# Patient Record
Sex: Female | Born: 1940
Health system: Southern US, Community
[De-identification: ages and names within clinical notes are randomized; demographics above are authoritative.]

## PROBLEM LIST (undated history)

## (undated) DIAGNOSIS — E119 Type 2 diabetes mellitus without complications: Secondary | ICD-10-CM

## (undated) DIAGNOSIS — I10 Essential (primary) hypertension: Secondary | ICD-10-CM

---

## 2014-04-03 DIAGNOSIS — F411 Generalized anxiety disorder: Secondary | ICD-10-CM | POA: Insufficient documentation

## 2014-04-03 DIAGNOSIS — E785 Hyperlipidemia, unspecified: Secondary | ICD-10-CM | POA: Insufficient documentation

## 2014-04-03 DIAGNOSIS — M810 Age-related osteoporosis without current pathological fracture: Secondary | ICD-10-CM | POA: Insufficient documentation

## 2014-04-04 DIAGNOSIS — I10 Essential (primary) hypertension: Secondary | ICD-10-CM | POA: Insufficient documentation

## 2014-07-11 DIAGNOSIS — J449 Chronic obstructive pulmonary disease, unspecified: Secondary | ICD-10-CM | POA: Insufficient documentation

## 2014-07-11 DIAGNOSIS — M5430 Sciatica, unspecified side: Secondary | ICD-10-CM | POA: Insufficient documentation

## 2014-07-11 DIAGNOSIS — N289 Disorder of kidney and ureter, unspecified: Secondary | ICD-10-CM | POA: Insufficient documentation

## 2014-08-07 DIAGNOSIS — E119 Type 2 diabetes mellitus without complications: Secondary | ICD-10-CM | POA: Insufficient documentation

## 2015-01-30 DIAGNOSIS — E876 Hypokalemia: Secondary | ICD-10-CM | POA: Insufficient documentation

## 2019-02-03 ENCOUNTER — Emergency Department (HOSPITAL_BASED_OUTPATIENT_CLINIC_OR_DEPARTMENT_OTHER)
Admission: EM | Admit: 2019-02-03 | Discharge: 2019-02-03 | Disposition: A | Discharge status: Home / Self Care | Payer: BLUE CROSS/BLUE SHIELD | Attending: Emergency Medicine | Admitting: Emergency Medicine

## 2019-02-03 ENCOUNTER — Other Ambulatory Visit: Payer: Self-pay

## 2019-02-03 ENCOUNTER — Emergency Department (HOSPITAL_BASED_OUTPATIENT_CLINIC_OR_DEPARTMENT_OTHER): Payer: BLUE CROSS/BLUE SHIELD

## 2019-02-03 ENCOUNTER — Encounter (HOSPITAL_BASED_OUTPATIENT_CLINIC_OR_DEPARTMENT_OTHER): Payer: Self-pay | Admitting: Emergency Medicine

## 2019-02-03 DIAGNOSIS — Z7982 Long term (current) use of aspirin: Secondary | ICD-10-CM | POA: Diagnosis not present

## 2019-02-03 DIAGNOSIS — I10 Essential (primary) hypertension: Secondary | ICD-10-CM | POA: Diagnosis not present

## 2019-02-03 DIAGNOSIS — M79605 Pain in left leg: Secondary | ICD-10-CM | POA: Diagnosis not present

## 2019-02-03 DIAGNOSIS — E876 Hypokalemia: Secondary | ICD-10-CM | POA: Diagnosis not present

## 2019-02-03 DIAGNOSIS — Z79899 Other long term (current) drug therapy: Secondary | ICD-10-CM | POA: Diagnosis not present

## 2019-02-03 DIAGNOSIS — E119 Type 2 diabetes mellitus without complications: Secondary | ICD-10-CM | POA: Diagnosis not present

## 2019-02-03 HISTORY — DX: Type 2 diabetes mellitus without complications: E11.9

## 2019-02-03 HISTORY — DX: Essential (primary) hypertension: I10

## 2019-02-03 LAB — CBC WITH DIFFERENTIAL/PLATELET
Abs Immature Granulocytes: 0.02 10*3/uL (ref 0.00–0.07)
Basophils Absolute: 0.1 10*3/uL (ref 0.0–0.1)
Basophils Relative: 1 %
Eosinophils Absolute: 0.2 10*3/uL (ref 0.0–0.5)
Eosinophils Relative: 3 %
HCT: 38.3 % (ref 36.0–46.0)
Hemoglobin: 12 g/dL (ref 12.0–15.0)
Immature Granulocytes: 0 %
Lymphocytes Relative: 17 %
Lymphs Abs: 1 10*3/uL (ref 0.7–4.0)
MCH: 28.7 pg (ref 26.0–34.0)
MCHC: 31.3 g/dL (ref 30.0–36.0)
MCV: 91.6 fL (ref 80.0–100.0)
Monocytes Absolute: 0.5 10*3/uL (ref 0.1–1.0)
Monocytes Relative: 9 %
Neutro Abs: 4 10*3/uL (ref 1.7–7.7)
Neutrophils Relative %: 70 %
Platelets: 291 10*3/uL (ref 150–400)
RBC: 4.18 MIL/uL (ref 3.87–5.11)
RDW: 14.4 % (ref 11.5–15.5)
WBC: 5.7 10*3/uL (ref 4.0–10.5)
nRBC: 0 % (ref 0.0–0.2)

## 2019-02-03 LAB — BASIC METABOLIC PANEL
ANION GAP: 11 (ref 5–15)
BUN: 13 mg/dL (ref 8–23)
CO2: 27 mmol/L (ref 22–32)
Calcium: 9.1 mg/dL (ref 8.9–10.3)
Chloride: 101 mmol/L (ref 98–111)
Creatinine, Ser: 0.61 mg/dL (ref 0.44–1.00)
GFR calc Af Amer: >60 mL/min (ref >60)
GFR calc non Af Amer: >60 mL/min (ref >60)
Glucose, Bld: 130 mg/dL — ABNORMAL HIGH (ref 70–99)
Potassium: 3.4 mmol/L — ABNORMAL LOW (ref 3.5–5.1)
Sodium: 139 mmol/L (ref 135–145)

## 2019-02-03 LAB — MAGNESIUM: Magnesium: 1.8 mg/dL (ref 1.7–2.4)

## 2019-02-03 MED ORDER — HYDROCODONE-ACETAMINOPHEN 5-325 MG PO TABS: 1.0000 | ORAL_TABLET | ORAL | 0 refills | Status: DC | PRN

## 2019-02-03 MED ORDER — POTASSIUM CHLORIDE CRYS ER 20 MEQ PO TBCR
40.0000 meq | EXTENDED_RELEASE_TABLET | Freq: Once | ORAL | Status: AC
  Administered 2019-02-03: 40 meq via ORAL
  Filled 2019-02-03: qty 2

## 2019-02-03 MED ORDER — HYDROCODONE-ACETAMINOPHEN 5-325 MG PO TABS
1.0000 | ORAL_TABLET | Freq: Once | ORAL | Status: AC
  Administered 2019-02-03: 1 via ORAL
  Filled 2019-02-03: qty 1

## 2019-02-03 NOTE — ED Provider Notes (Signed)
[facility] EMERGENCY DEPARTMENT Provider Note   CSN: [id number] Arrival date & time: 02/03/19  1027    History   Chief Complaint Chief Complaint  Patient presents with  . Leg Pain    HPI Stephanie Pace is a 78 y.o. female.     Pt presents to the ED today with left leg pain.  It has been hurting for the past few days.  It is worse when she first gets up in the morning, and usually goes away.  Yesterday, it would not go away.  She denies any trauma.  She denies sob or cp.     Past Medical History:  Diagnosis Date  . Diabetes mellitus without complication (HCC)   . Hypertension     There are no active problems to display for this patient.   History reviewed. No pertinent surgical history.   OB History   No obstetric history on file.      Home Medications    Prior to Admission medications   Medication Sig Start Date End Date Taking? Authorizing Provider  aspirin EC 81 MG tablet Take by mouth. 04/03/14  Yes [provider]  atorvastatin (LIPITOR) 20 MG tablet Take by mouth. 06/22/17  Yes [provider]  omeprazole (PRILOSEC OTC) 20 MG tablet Take by mouth. 04/04/14  Yes [provider]  pioglitazone (ACTOS) 30 MG tablet TAKE 1 TABLET BY MOUTH DAILY 07/13/17  Yes [provider]  HYDROcodone-acetaminophen (NORCO/VICODIN) 5-325 MG tablet Take 1 tablet by mouth every 4 (four) hours as needed. 02/03/19   Jacalyn Lefevre, MD  metFORMIN (GLUCOPHAGE) 500 MG tablet  01/27/19   [provider]  valsartan-hydrochlorothiazide (DIOVAN-HCT) 320-25 MG tablet TK 1 T PO D 10/22/18   [provider]    Family History History reviewed. No pertinent family history.  Social History Social History   Tobacco Use  . Smoking status: Current Every Day Smoker    Types: Cigarettes  . Smokeless tobacco: Never Used  Substance Use Topics  . Alcohol use: Never    Frequency: Never  . Drug use: Never     Allergies   Patient  has no known allergies.   Review of Systems Review of Systems  Musculoskeletal:       Left leg pain  All other systems reviewed and are negative.    Physical Exam Updated Vital Signs BP (!) 185/67 (BP Location: Right Arm)   Pulse 81   Temp 98.2 F (36.8 C) (Oral)   Resp 18   Ht  (1.626 m)   Wt 81.6 kg   SpO2 95%   BMI 30.90 kg/m   Physical Exam Vitals signs and nursing note reviewed.  Constitutional:      Appearance: Normal appearance.  HENT:     Head: Normocephalic and atraumatic.     Right Ear: External ear normal.     Left Ear: External ear normal.     Nose: Nose normal.     Mouth/Throat:     Mouth: Mucous membranes are moist.  Eyes:     Extraocular Movements: Extraocular movements intact.     Pupils: Pupils are equal, round, and reactive to light.  Neck:     Musculoskeletal: Normal range of motion and neck supple.  Cardiovascular:     Rate and Rhythm: Normal rate and regular rhythm.     Pulses: Normal pulses.     Heart sounds: Normal heart sounds.  Pulmonary:     Effort: Pulmonary effort is normal.  Breath sounds: Normal breath sounds.  Abdominal:     General: Abdomen is flat.     Palpations: Abdomen is soft.  Musculoskeletal: Normal range of motion.  Skin:    General: Skin is warm.     Capillary Refill: Capillary refill takes less than 2 seconds.  Neurological:     General: No focal deficit present.     Mental Status: She is alert and oriented to person, place, and time.  Psychiatric:        Mood and Affect: Mood normal.        Behavior: Behavior normal.      ED Treatments / Results  Labs (all labs ordered are listed, but only abnormal results are displayed) Labs Reviewed  BASIC METABOLIC PANEL - Abnormal; Notable for the following components:      Result Value   Potassium 3.4 (*)    Glucose, Bld 130 (*)    All other components within normal limits  CBC WITH DIFFERENTIAL/PLATELET  MAGNESIUM    EKG None  Radiology US Venous  Img Lower Unilateral Left  Result Date: 02/03/2019 CLINICAL DATA:  78 year old female with a history of pain and swelling EXAM: LEFT LOWER EXTREMITY VENOUS DOPPLER ULTRASOUND TECHNIQUE: Gray-scale sonography with graded compression, as well as color Doppler and duplex ultrasound were performed to evaluate the lower extremity deep venous systems from the level of the common femoral vein and including the common femoral, femoral, profunda femoral, popliteal and calf veins including the posterior tibial, peroneal and gastrocnemius veins when visible. The superficial great saphenous vein was also interrogated. Spectral Doppler was utilized to evaluate flow at rest and with distal augmentation maneuvers in the common femoral, femoral and popliteal veins. COMPARISON:  None. FINDINGS: Contralateral Common Femoral Vein: Respiratory phasicity is normal and symmetric with the symptomatic side. No evidence of thrombus. Normal compressibility. Common Femoral Vein: No evidence of thrombus. Normal compressibility, respiratory phasicity and response to augmentation. Saphenofemoral Junction: No evidence of thrombus. Normal compressibility and flow on color Doppler imaging. Profunda Femoral Vein: No evidence of thrombus. Normal compressibility and flow on color Doppler imaging. Femoral Vein: No evidence of thrombus. Normal compressibility, respiratory phasicity and response to augmentation. Popliteal Vein: No evidence of thrombus. Normal compressibility, respiratory phasicity and response to augmentation. Calf Veins: No evidence of thrombus. Normal compressibility and flow on color Doppler imaging. Superficial Great Saphenous Vein: No evidence of thrombus. Normal compressibility and flow on color Doppler imaging. Other Findings:  None. IMPRESSION: Sonographic survey of the left lower extremity negative for DVT Electronically Signed   By: Gilmer Mor D.O.   On: 02/03/2019 11:42    Procedures Procedures (including critical  care time)  Medications Ordered in ED Medications  potassium chloride SA (K-DUR,KLOR-CON) CR tablet 40 mEq (has no administration in time range)  HYDROcodone-acetaminophen (NORCO/VICODIN) 5-325 MG per tablet 1 tablet (1 tablet Oral Given 02/03/19 1035)     Initial Impression / Assessment and Plan / ED Course  I have reviewed the triage vital signs and the nursing notes.  Pertinent labs & imaging results that were available during my care of the patient were reviewed by me and considered in my medical decision making (see chart for details).       Pt is mildly hypokalemia, so she is given 40 meq of Kdur here.  She does not have a DVT.  She likely has a msk strain.  She is instructed to return if worse.  F/u with pcp.  Final Clinical Impressions(s) / ED Diagnoses  Final diagnoses:  Left leg pain  Hypokalemia    ED Discharge Orders         Ordered    HYDROcodone-acetaminophen (NORCO/VICODIN) 5-325 MG tablet  Every 4 hours PRN     02/03/19 1154           Jacalyn Lefevre, MD 02/03/19 1155

## 2019-02-03 NOTE — ED Triage Notes (Signed)
Reports right leg pain x 2 days without injury.

## 2019-02-03 NOTE — ED Notes (Signed)
Patient transported to Ultrasound 

## 2019-02-04 ENCOUNTER — Ambulatory Visit (INDEPENDENT_AMBULATORY_CARE_PROVIDER_SITE_OTHER): Payer: Self-pay

## 2019-02-04 ENCOUNTER — Ambulatory Visit (INDEPENDENT_AMBULATORY_CARE_PROVIDER_SITE_OTHER): Payer: BLUE CROSS/BLUE SHIELD | Admitting: Family Medicine

## 2019-02-04 ENCOUNTER — Encounter (INDEPENDENT_AMBULATORY_CARE_PROVIDER_SITE_OTHER): Payer: Self-pay | Admitting: Family Medicine

## 2019-02-04 DIAGNOSIS — M79605 Pain in left leg: Secondary | ICD-10-CM

## 2019-02-04 MED ORDER — NABUMETONE 750 MG PO TABS: 750.0000 mg | ORAL_TABLET | Freq: Two times a day (BID) | ORAL | 6 refills | Status: DC | PRN

## 2019-02-04 NOTE — Progress Notes (Signed)
Office Visit Note   Patient: Stephanie Pace           Date of Birth: May 26, 1941           MRN: [id number] Visit Date: 02/04/2019 Requested by: Shellia Cleverly, PA [address] [place], [place] [address] PCP: Shellia Cleverly, [place]  Subjective: Chief Complaint  Patient presents with  . Left Leg - Pain    Pain in thigh since last week, mainly with getting up and down from seated position. Now also hurts when walking. No numbness/tingling.    HPI: She is here with left leg pain.  Symptoms started last week, no injury.  She got up and felt pain from the left posterior lateral hip to the medial thigh to the anterior tibia.  No change in her activities to account for her pain.  She got progressively worse and went to urgent care yesterday where Dopplers were negative for DVT.  She is not taking anything for her pain.  Denies any low back pain, denies any numbness or weakness in her leg.  No history of stress fractures.  She has a history of diabetes, COPD, osteoporosis and hypertension.               ROS: Other systems are reviewed and are negative.  Objective: Vital Signs: There were no vitals taken for this visit.  Physical Exam:  Left leg: No significant tenderness over the lumbar spine or in the sciatic notch.  Moderate tenderness over the left greater trochanter and that seems to reproduce some of her pain.  No pain with internal hip rotation and her range of motion is good.  There is slight tenderness on the medial quadricep/VMO.  No knee effusion, good range of motion.  No significant joint line tenderness at the knee.  There is moderate tenderness at the tibia anteriorly proximal one third.  Lower extremity strength and reflexes are normal.  Imaging: X-rays left hip: Well-preserved joint space, very little arthritis.  No sign of stress fracture or neoplasm.  X-rays left tibia/fibula: Knee and ankle joints look healthy.  No sign of tibia stress fracture.   Assessment & Plan: 1.  Left hip and  leg pain, etiology uncertain.  Could be tendinopathy but cannot rule out lumbar radiculopathy, although not having any significant back pain. -Short-term use of anti-inflammatory.  Straight leg raises for strengthening.  Consider physical therapy if symptoms persist.     Procedures: No procedures performed  No notes on file     PMFS History: Patient Active Problem List   Diagnosis Date Noted  . Hypokalemia 01/30/2015  . Type 2 diabetes mellitus without complication (HCC) 08/07/2014  . COPD (chronic obstructive pulmonary disease) (HCC) 07/11/2014  . Renal insufficiency 07/11/2014  . Sciatica 07/11/2014  . Essential hypertension 04/04/2014  . Generalized anxiety disorder 04/03/2014  . Hyperlipidemia LDL goal <100 04/03/2014  . Osteoporosis 04/03/2014   Past Medical History:  Diagnosis Date  . Diabetes mellitus without complication (HCC)   . Hypertension     History reviewed. No pertinent family history.  History reviewed. No pertinent surgical history. Social History   Occupational History  . Not on file  Tobacco Use  . Smoking status: Current Every Day Smoker    Types: Cigarettes  . Smokeless tobacco: Never Used  Substance and Sexual Activity  . Alcohol use: Never    Frequency: Never  . Drug use: Never  . Sexual activity: Not on file

## 2019-02-07 ENCOUNTER — Telehealth (INDEPENDENT_AMBULATORY_CARE_PROVIDER_SITE_OTHER): Payer: Self-pay

## 2019-02-07 ENCOUNTER — Encounter (INDEPENDENT_AMBULATORY_CARE_PROVIDER_SITE_OTHER): Payer: Self-pay

## 2019-02-07 NOTE — Telephone Encounter (Signed)
Patient requesting note to be out of work starting today for one week, since she's unable to sit, increasingly uncomfortable over the weekend  Fax#[phone] for her work  [employer] #[phone]

## 2019-02-07 NOTE — Telephone Encounter (Signed)
I wrote and faxed a note - patient advised.

## 2019-02-08 MED ORDER — TIZANIDINE HCL 2 MG PO TABS: 2.0000 mg | ORAL_TABLET | Freq: Four times a day (QID) | ORAL | 1 refills | Status: DC | PRN

## 2019-02-08 MED ORDER — METHYLPREDNISOLONE 4 MG PO TBPK: ORAL_TABLET | ORAL | 0 refills | Status: DC

## 2019-02-08 NOTE — Addendum Note (Signed)
Addended by: Lillia Carmel on: 02/08/2019 12:19 PM   Modules accepted: Orders

## 2019-02-08 NOTE — Addendum Note (Signed)
Addended by: Ariadna Setter J on: 02/08/2019 12:19 PM   Modules accepted: Orders  

## 2019-02-08 NOTE — Telephone Encounter (Signed)
I advised the patient's husband of the new meds that were sent in. She should stop the Nabumetone while on the Medrol dosepak, but may resume it once she has finished it (if needed). They are to call back if symptoms worsen or if she fails to improve.

## 2019-02-08 NOTE — Telephone Encounter (Signed)
Muscle relaxant and medrol pack sent to pharmacy.  Not sure if injection would help.  If pain doesn't improve, might order lumbar MRI.

## 2019-02-08 NOTE — Telephone Encounter (Signed)
The patient reports having a lot of trouble getting out of bed first thing in the morning, due to the pain going down her buttock and leg.  She also has pain with standing up after sitting a while.  She & her husband want to know if a muscle relaxer might or even a cortisone injection.

## 2020-01-25 ENCOUNTER — Encounter: Payer: Self-pay | Admitting: Family Medicine

## 2020-01-25 ENCOUNTER — Other Ambulatory Visit: Payer: Self-pay

## 2020-01-25 ENCOUNTER — Ambulatory Visit (INDEPENDENT_AMBULATORY_CARE_PROVIDER_SITE_OTHER): Payer: Medicare Other | Admitting: Family Medicine

## 2020-01-25 ENCOUNTER — Ambulatory Visit: Payer: Self-pay

## 2020-01-25 DIAGNOSIS — G8929 Other chronic pain: Secondary | ICD-10-CM | POA: Diagnosis not present

## 2020-01-25 DIAGNOSIS — M5442 Lumbago with sciatica, left side: Secondary | ICD-10-CM

## 2020-01-25 NOTE — Progress Notes (Signed)
   Office Visit Note   Patient: Stephanie Pace           Date of Birth: 02-01-1941           MRN: [id number] Visit Date: 01/25/2020 Requested by: Shellia Cleverly, PA [address] [place],  [place] [address] PCP: Shellia Cleverly, [place]  Subjective: Chief Complaint  Patient presents with  . Lower Back - Pain    Pain across the lower back and down the left leg. Pain anterior and posterior thigh at times. No numbness/tingling, but has some burning sensations in the leg.    HPI: She is here with persistent left leg pain and low back pain.  Last time we talked her symptoms were primarily in the leg and she continues to have intermittent pain to this day.  But in the past couple weeks her lower back has been bothering her as well.  No numbness or tingling, no weakness in her leg.  Extra strength Tylenol gets rid of her pain for 7 or 8 hours.  Pain is very manageable right now.  Denies any bowel or bladder dysfunction.              ROS: No fevers or chills.  All other systems were reviewed and are negative.  Objective: Vital Signs: There were no vitals taken for this visit.  Physical Exam:  General:  Alert and oriented, in no acute distress. Pulm:  Breathing unlabored. Psy:  Normal mood, congruent affect. Skin: No rash. Low back: Slightly tender in the midline L4-5 level.  No significant pain in the sciatic notch or over the greater trochanter.  Good hip range of motion and no pain with internal rotation.  Negative straight leg raise, lower extremity strength and reflexes are normal.  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  Mild at the upper levels.  Hip joints are well-preserved, SI joints are unremarkable, no sign of neoplasm or compression deformity.    Assessment & Plan: 1.  Persistent left leg pain now with low back pain, question whether she might have foraminal stenosis. -We discussed options, her symptoms are very manageable with  over-the-counter Tylenol right now.  As long as this continues to work she will follow-up as needed.  If symptoms worsen, then physical therapy.  If still no relief, then MRI scan.     Procedures: No procedures performed  No notes on file     PMFS History: Patient Active Problem List   Diagnosis Date Noted  . Hypokalemia 01/30/2015  . Type 2 diabetes mellitus without complication (HCC) 08/07/2014  . COPD (chronic obstructive pulmonary disease) (HCC) 07/11/2014  . Renal insufficiency 07/11/2014  . Sciatica 07/11/2014  . Essential hypertension 04/04/2014  . Generalized anxiety disorder 04/03/2014  . Hyperlipidemia LDL goal <100 04/03/2014  . Osteoporosis 04/03/2014   Past Medical History:  Diagnosis Date  . Diabetes mellitus without complication (HCC)   . Hypertension     History reviewed. No pertinent family history.  History reviewed. No pertinent surgical history. Social History   Occupational History  . Not on file  Tobacco Use  . Smoking status: Current Every Day Smoker    Types: Cigarettes  . Smokeless tobacco: Never Used  Substance and Sexual Activity  . Alcohol use: Never  . Drug use: Never  . Sexual activity: Not on file

## 2021-01-21 ENCOUNTER — Emergency Department (HOSPITAL_BASED_OUTPATIENT_CLINIC_OR_DEPARTMENT_OTHER): Payer: Medicare Other

## 2021-01-21 ENCOUNTER — Other Ambulatory Visit (HOSPITAL_COMMUNITY): Payer: Self-pay | Admitting: Student

## 2021-01-21 ENCOUNTER — Other Ambulatory Visit: Payer: Self-pay

## 2021-01-21 ENCOUNTER — Emergency Department (HOSPITAL_BASED_OUTPATIENT_CLINIC_OR_DEPARTMENT_OTHER)
Admission: EM | Admit: 2021-01-21 | Discharge: 2021-01-21 | Disposition: A | Discharge status: Home / Self Care | Payer: Medicare Other | Attending: Emergency Medicine | Admitting: Emergency Medicine

## 2021-01-21 ENCOUNTER — Encounter (HOSPITAL_BASED_OUTPATIENT_CLINIC_OR_DEPARTMENT_OTHER): Payer: Self-pay | Admitting: Emergency Medicine

## 2021-01-21 ENCOUNTER — Telehealth: Payer: Self-pay | Admitting: Family Medicine

## 2021-01-21 DIAGNOSIS — E119 Type 2 diabetes mellitus without complications: Secondary | ICD-10-CM | POA: Insufficient documentation

## 2021-01-21 DIAGNOSIS — I4891 Unspecified atrial fibrillation: Secondary | ICD-10-CM | POA: Diagnosis not present

## 2021-01-21 DIAGNOSIS — F17210 Nicotine dependence, cigarettes, uncomplicated: Secondary | ICD-10-CM | POA: Insufficient documentation

## 2021-01-21 DIAGNOSIS — I10 Essential (primary) hypertension: Secondary | ICD-10-CM | POA: Diagnosis not present

## 2021-01-21 DIAGNOSIS — M79605 Pain in left leg: Secondary | ICD-10-CM | POA: Insufficient documentation

## 2021-01-21 DIAGNOSIS — Z7984 Long term (current) use of oral hypoglycemic drugs: Secondary | ICD-10-CM | POA: Insufficient documentation

## 2021-01-21 DIAGNOSIS — Z79899 Other long term (current) drug therapy: Secondary | ICD-10-CM | POA: Insufficient documentation

## 2021-01-21 DIAGNOSIS — J449 Chronic obstructive pulmonary disease, unspecified: Secondary | ICD-10-CM | POA: Diagnosis not present

## 2021-01-21 DIAGNOSIS — Z7901 Long term (current) use of anticoagulants: Secondary | ICD-10-CM | POA: Diagnosis not present

## 2021-01-21 DIAGNOSIS — H1013 Acute atopic conjunctivitis, bilateral: Secondary | ICD-10-CM | POA: Insufficient documentation

## 2021-01-21 LAB — BASIC METABOLIC PANEL
Anion gap: 10 (ref 5–15)
BUN: 11 mg/dL (ref 8–23)
CO2: 30 mmol/L (ref 22–32)
Calcium: 8.1 mg/dL — ABNORMAL LOW (ref 8.9–10.3)
Chloride: 96 mmol/L — ABNORMAL LOW (ref 98–111)
Creatinine, Ser: 0.64 mg/dL (ref 0.44–1.00)
GFR, Estimated: >60 mL/min (ref >60)
Glucose, Bld: 110 mg/dL — ABNORMAL HIGH (ref 70–99)
Potassium: 2.7 mmol/L — CL (ref 3.5–5.1)
Sodium: 136 mmol/L (ref 135–145)

## 2021-01-21 LAB — COMPREHENSIVE METABOLIC PANEL WITH GFR
ALT: 12 U/L (ref 0–44)
AST: 19 U/L (ref 15–41)
Albumin: 3.5 g/dL (ref 3.5–5.0)
Alkaline Phosphatase: 41 U/L (ref 38–126)
Anion gap: 12 (ref 5–15)
BUN: 12 mg/dL (ref 8–23)
CO2: 32 mmol/L (ref 22–32)
Calcium: 8.9 mg/dL (ref 8.9–10.3)
Chloride: 92 mmol/L — ABNORMAL LOW (ref 98–111)
Creatinine, Ser: 0.69 mg/dL (ref 0.44–1.00)
GFR, Estimated: >60 mL/min
Glucose, Bld: 115 mg/dL — ABNORMAL HIGH (ref 70–99)
Potassium: 2.3 mmol/L — CL (ref 3.5–5.1)
Sodium: 136 mmol/L (ref 135–145)
Total Bilirubin: 0.8 mg/dL (ref 0.3–1.2)
Total Protein: 6.6 g/dL (ref 6.5–8.1)

## 2021-01-21 LAB — CBC WITH DIFFERENTIAL/PLATELET
Abs Immature Granulocytes: 0.01 K/uL (ref 0.00–0.07)
Basophils Absolute: 0 K/uL (ref 0.0–0.1)
Basophils Relative: 1 %
Eosinophils Absolute: 0.1 K/uL (ref 0.0–0.5)
Eosinophils Relative: 1 %
HCT: 35 % — ABNORMAL LOW (ref 36.0–46.0)
Hemoglobin: 10.9 g/dL — ABNORMAL LOW (ref 12.0–15.0)
Immature Granulocytes: 0 %
Lymphocytes Relative: 16 %
Lymphs Abs: 1.1 K/uL (ref 0.7–4.0)
MCH: 27 pg (ref 26.0–34.0)
MCHC: 31.1 g/dL (ref 30.0–36.0)
MCV: 86.8 fL (ref 80.0–100.0)
Monocytes Absolute: 0.6 K/uL (ref 0.1–1.0)
Monocytes Relative: 9 %
Neutro Abs: 4.8 K/uL (ref 1.7–7.7)
Neutrophils Relative %: 73 %
Platelets: 367 K/uL (ref 150–400)
RBC: 4.03 MIL/uL (ref 3.87–5.11)
RDW: 16.4 % — ABNORMAL HIGH (ref 11.5–15.5)
WBC: 6.6 K/uL (ref 4.0–10.5)
nRBC: 0 % (ref 0.0–0.2)

## 2021-01-21 LAB — MAGNESIUM: Magnesium: 1.5 mg/dL — ABNORMAL LOW (ref 1.7–2.4)

## 2021-01-21 MED ORDER — POTASSIUM CHLORIDE ER 10 MEQ PO TBCR: 20.0000 meq | EXTENDED_RELEASE_TABLET | Freq: Every day | ORAL | 0 refills | Status: DC

## 2021-01-21 MED ORDER — HYDRALAZINE HCL 25 MG PO TABS
50.0000 mg | ORAL_TABLET | Freq: Once | ORAL | Status: AC
  Administered 2021-01-21: 50 mg via ORAL
  Filled 2021-01-21: qty 2

## 2021-01-21 MED ORDER — MAGNESIUM SULFATE 50 % IJ SOLN
2.0000 g | Freq: Once | INTRAMUSCULAR | Status: AC
  Administered 2021-01-21: 2 g via INTRAVENOUS
  Filled 2021-01-21: qty 4

## 2021-01-21 MED ORDER — HYDRALAZINE HCL 25 MG PO TABS: 25.0000 mg | ORAL_TABLET | Freq: Two times a day (BID) | ORAL | 0 refills | Status: DC | PRN

## 2021-01-21 MED ORDER — POTASSIUM CHLORIDE CRYS ER 20 MEQ PO TBCR
40.0000 meq | EXTENDED_RELEASE_TABLET | Freq: Once | ORAL | Status: AC
  Administered 2021-01-21: 40 meq via ORAL
  Filled 2021-01-21: qty 2

## 2021-01-21 MED ORDER — POTASSIUM CHLORIDE CRYS ER 20 MEQ PO TBCR
20.0000 meq | EXTENDED_RELEASE_TABLET | Freq: Once | ORAL | Status: DC
  Filled 2021-01-21: qty 1

## 2021-01-21 MED ORDER — APIXABAN 5 MG PO TABS: 5.0000 mg | ORAL_TABLET | Freq: Two times a day (BID) | ORAL | 0 refills | Status: DC

## 2021-01-21 MED ORDER — POTASSIUM CHLORIDE CRYS ER 20 MEQ PO TBCR
30.0000 meq | EXTENDED_RELEASE_TABLET | Freq: Once | ORAL | Status: AC
  Administered 2021-01-21: 30 meq via ORAL

## 2021-01-21 MED ORDER — POTASSIUM CHLORIDE 10 MEQ/100ML IV SOLN
10.0000 meq | INTRAVENOUS | Status: AC
  Administered 2021-01-21 (×2): 10 meq via INTRAVENOUS
  Filled 2021-01-21 (×2): qty 100

## 2021-01-21 MED ORDER — APIXABAN 2.5 MG PO TABS
5.0000 mg | ORAL_TABLET | Freq: Two times a day (BID) | ORAL | Status: DC
  Administered 2021-01-21: 5 mg via ORAL
  Filled 2021-01-21: qty 2

## 2021-01-21 MED ORDER — FLUTICASONE PROPIONATE 50 MCG/ACT NA SUSP: 2.0000 | Freq: Every day | NASAL | 0 refills | Status: DC

## 2021-01-21 MED FILL — ELIQUIS 5 MG TABLET: 5 | 30 days supply | Qty: 60 | Fill #0

## 2021-01-21 NOTE — ED Notes (Signed)
Patient is reporting intermittent blurred vision that starts in the morning and recedes some in the evening. Patient reports bilateral problems with vision but the right eye is worse than the left. Patient reports she currently has blurred vision.

## 2021-01-21 NOTE — ED Notes (Signed)
Blood recollected for CMP and Magnesium level check.

## 2021-01-21 NOTE — Telephone Encounter (Signed)
Patient and Chrystine Oiler called requesting an appointment. Eunice Blase is asking for patient to be seen today. [name] asked me to send this message to Camelia Eng about pt. Patient states she is experiencing blurred vision and sever left leg pains. Blurred eyes started alittle over a week and leg pains started a week ago. Please call [name] at [phone].

## 2021-01-21 NOTE — ED Notes (Signed)
ED Provider at bedside. 

## 2021-01-21 NOTE — Discharge Instructions (Addendum)
Please follow-up with your primary care provider in regards to today's visits in regards to your blood pressure management, your new A. fib, your allergic conjunctivitis, your low potassium. Please follow-up with the ophthalmologist, in the meantime I want you to take the Flonase and the allergy medications that we discussed such as Zyrtec..  They may prescribe you an allergy drops that you can put into your eye, please use cool compresses on your eye.  You can use the attached instructions on this.  If you do notice that you have any pain with your eyes, complete loss of vision or any new worsening concerning symptoms please come back to the emergency department. Your blood pressure was significantly elevated today here, you need to talk to your primary care about alternating your blood pressure medication.  I would recommend stopping the valsartan-HCTZ and starting hydralazine, however please talk to your primary care about this. Your EKG did show new atrial fibrillation, please take the anticoagulant as we discussed, you see attached instructions on what A. fib was.  Do know that this is a blood thinner and there are side effects to this. I do advise stopping the Asprin, but please follow up with your PCP about this. Try to be careful about falling and if you to follow-up please come back to the emergency department, speak to your pharmacist about complete side effect profile and interactions of this. Take the potassium as discussed.  You have any chest pain or any new or worsening concerning symptoms like back to the emergency department.  Information on my medicine - ELIQUIS (apixaban)  This medication education was reviewed with me or my healthcare representative as part of my discharge preparation.   Why was Eliquis prescribed for you? Eliquis was prescribed for you to reduce the risk of a blood clot forming that can cause a stroke if you have a medical condition called atrial fibrillation  (a type of irregular heartbeat).  What do You need to know about Eliquis ? Take your Eliquis TWICE DAILY - one tablet in the morning and one tablet in the evening with or without food. If you have difficulty swallowing the tablet whole please discuss with your pharmacist how to take the medication safely.  Take Eliquis exactly as prescribed by your doctor and DO NOT stop taking Eliquis without talking to the doctor who prescribed the medication.  Stopping may increase your risk of developing a stroke.  Refill your prescription before you run out.  After discharge, you should have regular check-up appointments with your healthcare provider that is prescribing your Eliquis.  In the future your dose may need to be changed if your kidney function or weight changes by a significant amount or as you get older.  What do you do if you miss a dose? If you miss a dose, take it as soon as you remember on the same day and resume taking twice daily.  Do not take more than one dose of ELIQUIS at the same time to make up a missed dose.  Important Safety Information A possible side effect of Eliquis is bleeding. You should call your healthcare provider right away if you experience any of the following: Bleeding from an injury or your nose that does not stop. Unusual colored urine (red or dark brown) or unusual colored stools (red or black). Unusual bruising for unknown reasons. A serious fall or if you hit your head (even if there is no bleeding).  Some medicines may interact with  Eliquis and might increase your risk of bleeding or clotting while on Eliquis. To help avoid this, consult your healthcare provider or pharmacist prior to using any new prescription or non-prescription medications, including herbals, vitamins, non-steroidal anti-inflammatory drugs (NSAIDs) and supplements.  This website has more information on Eliquis (apixaban): http://www.eliquis.com/eliquis/home

## 2021-01-21 NOTE — ED Provider Notes (Signed)
[facility] EMERGENCY DEPARTMENT Provider Note   CSN: [id number] Arrival date & time: 01/21/21  1043     History Chief Complaint  Patient presents with  . Leg Pain    Stephanie Pace is a 80 y.o. female with past medical history of diabetes, hypertension, COPD, sciatica, renal insufficiency, osteoporosis, generalized anxiety disorder that presents to the emergency department today for left leg pain.  Patient states that the left leg pain started 3 days ago, is in her thigh on the left side, no right leg pain.  Patient states that she is pain-free when laying, worse when she walks.  States that she has been using a heating pad which has been helping slightly.  Patient states that the thing that helps the most is mustard seeds.  Has been taking anything else.  Patient states that it has been getting worse over the past 3 days.  Denies any falls or trauma in the last 2 weeks.  Pain does not radiate anywhere.  No back pain or hip pain.  No history of arthritis in the hip on that side.  Denies any fevers, chills, night sweats, nausea, vomiting, diarrhea, abdominal pain.  Denies any numbness or tingling in her leg, or groin.  Denies any cancer history, coagulation disorder, recent long travel.  Appears as if patient follows [facility] for [facility] care, daughter-in-law is also in room who states that she also wants to be evaluated for her blurred vision.  Patient states that she has had blurred vision for the past week and a half on both eyes started on right eye, now both eyes, this started after she had a head cold which she has had for the past 3 weeks.  States that she just had congestion and watery eyes for the past 3 weeks, now has blurred vision.  Denies any cough, fever, sore throat, facial pain, sick contacts.  Patient has not been vaccinated against COVID.  Patient states that blurred vision is primarily in the morning and then gets better throughout the day.  States that she  has a lot of crusting in these eyes especially in the morning when she does have blurred vision.  Patient her eyes, admits to itching.  Denies any eye pain or vision loss.  Denies any contact use or feeling as if there is something in her eye.  Patient states that she does wear glasses.  Denies any headache, dizziness, head trauma.  Patient states that she has been taking her blood pressure medication.  Has never been diagnosed with A. fib, is not on anticoagulant.  Denies any chest pain or shortness of breath.     After reviewing patient's records from February 11 at [facility], was recommended to come to the emergency department for questionable retinal vein occlusion.  I did speak to patient about this, she states that she did not go to the emergency department, states that she never had any vision loss, just had blurred vision on her right eye at that time, now has progressed to both eyes.  Has not followed up with ophthalmology.  HPI     Past Medical History:  Diagnosis Date  . Diabetes mellitus without complication (HCC)   . Hypertension     Patient Active Problem List   Diagnosis Date Noted  . Hypokalemia 01/30/2015  . Type 2 diabetes mellitus without complication (HCC) 08/07/2014  . COPD (chronic obstructive pulmonary disease) (HCC) 07/11/2014  . Renal insufficiency 07/11/2014  . Sciatica 07/11/2014  .  Essential hypertension 04/04/2014  . Generalized anxiety disorder 04/03/2014  . Hyperlipidemia LDL goal <100 04/03/2014  . Osteoporosis 04/03/2014    History reviewed. No pertinent surgical history.   OB History   No obstetric history on file.     No family history on file.  Social History   Tobacco Use  . Smoking status: Current Every Day Smoker    Types: Cigarettes  . Smokeless tobacco: Never Used  Substance Use Topics  . Alcohol use: Never  . Drug use: Never    Home Medications Prior to Admission medications   Medication Sig Start Date End Date  Taking? Authorizing Provider  atorvastatin (LIPITOR) 20 MG tablet Take by mouth. 06/22/17  Yes [provider]  calcium citrate-vitamin D (CITRACAL+D) 315-200 MG-UNIT tablet Take by mouth.   Yes [provider]  carvedilol (COREG) 25 MG tablet  08/18/18  Yes [provider]  fenofibrate 160 MG tablet  10/22/18  Yes [provider]  fluticasone (FLONASE) 50 MCG/ACT nasal spray Place 2 sprays into both nostrils daily for 7 doses. 01/21/21 01/28/21 Yes Dariann Huckaba, PA-C  hydrALAZINE (APRESOLINE) 25 MG tablet Take 1 tablet (25 mg total) by mouth 2 (two) times daily as needed. 01/21/21 02/20/21 Yes Farrel Gordon, PA-C  metFORMIN (GLUCOPHAGE) 500 MG tablet  01/27/19  Yes [provider]  omeprazole (PRILOSEC OTC) 20 MG tablet Take by mouth. 04/04/14  Yes [provider]  oxybutynin (DITROPAN-XL) 10 MG 24 hr tablet Take 10 mg by mouth at bedtime.   Yes [provider]  pioglitazone (ACTOS) 30 MG tablet TAKE 1 TABLET BY MOUTH DAILY 07/13/17  Yes [provider]  potassium chloride (KLOR-CON) 10 MEQ tablet Take 2 tablets (20 mEq total) by mouth daily for 10 days. 01/21/21 01/31/21 Yes Jaquala Fuller, PA-C  valsartan-hydrochlorothiazide (DIOVAN-HCT) 320-25 MG tablet TK 1 T PO D 10/22/18  Yes [provider]  apixaban (ELIQUIS) 5 MG TABS tablet Take 1 tablet (5 mg total) by mouth 2 (two) times daily. 01/21/21 02/20/21  Farrel Gordon, PA-C  tiZANidine (ZANAFLEX) 2 MG tablet Take 1-2 tablets (2-4 mg total) by mouth every 6 (six) hours as needed for muscle spasms. 02/08/19   Hilts, Casimiro Needle, MD    Allergies    Patient has no known allergies.  Review of Systems   Review of Systems  Constitutional: Negative for chills, diaphoresis, fatigue and fever.  HENT: Negative for congestion, sore throat and trouble swallowing.   Eyes: Positive for visual disturbance. Negative for photophobia, pain, redness and itching.  Respiratory: Negative for cough,  shortness of breath and wheezing.   Cardiovascular: Negative for chest pain, palpitations and leg swelling.  Gastrointestinal: Negative for abdominal distention, abdominal pain, diarrhea, nausea and vomiting.  Genitourinary: Negative for difficulty urinating.  Musculoskeletal: Positive for arthralgias. Negative for back pain, neck pain and neck stiffness.  Skin: Negative for pallor.  Neurological: Negative for dizziness, speech difficulty, weakness and headaches.  Psychiatric/Behavioral: Negative for confusion.    Physical Exam Updated Vital Signs BP (!) 186/90 (BP Location: Left Arm)   Pulse 74   Temp 98.3 F (36.8 C) (Oral)   Resp 18   Ht 5\' 4"  (1.626 m)   Wt 77.6 kg   SpO2 95%   BMI 29.35 kg/m   Physical Exam Constitutional:      General: She is not in acute distress.    Appearance: Normal appearance. She is not ill-appearing, toxic-appearing or diaphoretic.  HENT:     Mouth/Throat:  Mouth: Mucous membranes are moist.     Pharynx: Oropharynx is clear.  Eyes:     General: Lids are normal. Lids are everted, no foreign bodies appreciated. Vision grossly intact. No scleral icterus.    Extraocular Movements: Extraocular movements intact.     Right eye: Normal extraocular motion and no nystagmus.     Left eye: Normal extraocular motion and no nystagmus.     Conjunctiva/sclera: Conjunctivae normal.     Pupils: Pupils are equal, round, and reactive to light.     Comments: Bilateral eyes slightly pink, no true injection.  Drainage bilaterally from each eye, clear drainage.  EOMs intact, PERRLA.  No nystagmus.  No painful EOMs.  Some edema around superior lid bilaterally, no erythema or warmth.  No facial tenderness.  Cardiovascular:     Rate and Rhythm: Normal rate. Rhythm irregular.     Pulses: Normal pulses.     Heart sounds: Normal heart sounds.  Pulmonary:     Effort: Pulmonary effort is normal. No respiratory distress.     Breath sounds: Normal breath sounds. No  stridor. No wheezing, rhonchi or rales.  Chest:     Chest wall: No tenderness.  Abdominal:     General: Abdomen is flat. There is no distension.     Palpations: Abdomen is soft.     Tenderness: There is no abdominal tenderness. There is no guarding or rebound.  Musculoskeletal:        General: No swelling or tenderness. Normal range of motion.     Cervical back: Normal range of motion and neck supple. No rigidity.     Right lower leg: No edema.     Left lower leg: No edema.       Legs:     Comments: Patient with slight tenderness in this area, no erythema or warmth.  Good leg raise.  No edema.  No tenderness to left hip or back.  No objective numbness.  PT pulses bilaterally 2+ with good cap refill.  Compartments are soft.  Normal range of motion to hip knee and ankle.  Good sensation throughout.  Skin:    General: Skin is warm and dry.     Capillary Refill: Capillary refill takes less than 2 seconds.     Coloration: Skin is not pale.  Neurological:     General: No focal deficit present.     Mental Status: She is alert and oriented to person, place, and time.  Psychiatric:        Mood and Affect: Mood normal.        Behavior: Behavior normal.     ED Results / Procedures / Treatments   Labs (all labs ordered are listed, but only abnormal results are displayed) Labs Reviewed  CBC WITH DIFFERENTIAL/PLATELET - Abnormal; Notable for the following components:      Result Value   Hemoglobin 10.9 (*)    HCT 35.0 (*)    RDW 16.4 (*)    All other components within normal limits  COMPREHENSIVE METABOLIC PANEL - Abnormal; Notable for the following components:   Potassium 2.3 (*)    Chloride 92 (*)    Glucose, Bld 115 (*)    All other components within normal limits  MAGNESIUM - Abnormal; Notable for the following components:   Magnesium 1.5 (*)    All other components within normal limits  BASIC METABOLIC PANEL - Abnormal; Notable for the following components:   Potassium 2.7 (*)     Chloride 96 (*)  Glucose, Bld 110 (*)    Calcium 8.1 (*)    All other components within normal limits    EKG None  Radiology US Venous Img Lower  Left (DVT Study)  Result Date: 01/21/2021 CLINICAL DATA:  80 year old with left thigh pain for 1 month. EXAM: LEFT LOWER EXTREMITY VENOUS DOPPLER ULTRASOUND TECHNIQUE: Gray-scale sonography with graded compression, as well as color Doppler and duplex ultrasound were performed to evaluate the lower extremity deep venous systems from the level of the common femoral vein and including the common femoral, femoral, profunda femoral, popliteal and calf veins including the posterior tibial, peroneal and gastrocnemius veins when visible. The superficial great saphenous vein was also interrogated. Spectral Doppler was utilized to evaluate flow at rest and with distal augmentation maneuvers in the common femoral, femoral and popliteal veins. COMPARISON:  02/03/2019 FINDINGS: Contralateral Common Femoral Vein: Respiratory phasicity is normal and symmetric with the symptomatic side. No evidence of thrombus. Normal compressibility. Common Femoral Vein: No evidence of thrombus. Normal compressibility, respiratory phasicity and response to augmentation. Saphenofemoral Junction: No evidence of thrombus. Normal compressibility and flow on color Doppler imaging. Profunda Femoral Vein: No evidence of thrombus. Normal compressibility and flow on color Doppler imaging. Femoral Vein: No evidence of thrombus. Normal compressibility, respiratory phasicity and response to augmentation. Popliteal Vein: No evidence of thrombus. Normal compressibility, respiratory phasicity and response to augmentation. Calf Veins: No evidence of thrombus. Normal compressibility and flow on color Doppler imaging. Superficial Great Saphenous Vein: No evidence of thrombus. Normal compressibility. Venous Reflux:  None. Other Findings:  None. IMPRESSION: Negative for deep venous thrombosis in left lower  extremity. Electronically Signed   By: Richarda Overlie M.D.   On: 01/21/2021 12:05    Procedures Procedures   Medications Ordered in ED Medications  apixaban (ELIQUIS) tablet 5 mg (5 mg Oral Given 01/21/21 1539)  potassium chloride SA (KLOR-CON) CR tablet 30 mEq (has no administration in time range)  hydrALAZINE (APRESOLINE) tablet 50 mg (50 mg Oral Given 01/21/21 1144)  potassium chloride 10 mEq in 100 mL IVPB (0 mEq Intravenous Stopped 01/21/21 1438)  potassium chloride SA (KLOR-CON) CR tablet 40 mEq (40 mEq Oral Given 01/21/21 1303)  magnesium sulfate (IV Push/IM) injection 2 g (2 g Intravenous Given 01/21/21 1307)    ED Course  I have reviewed the triage vital signs and the nursing notes.  Pertinent labs & imaging results that were available during my care of the patient were reviewed by me and considered in my medical decision making (see chart for details).  Clinical Course as of 01/21/21 1622  Mon Jan 21, 2021  1320 I was directly involved in this patients medical care.  [JH]    Clinical Course User Index [JH] Cheryll Cockayne, MD   MDM Rules/Calculators/A&P     CHA2DS2-VASc Score: 5                     Stephanie Pace is a 80 y.o. female with past medical history of diabetes, hypertension, COPD, sciatica, renal insufficiency, osteoporosis, generalized anxiety disorder that presents to the emergency department today for left leg pain and blurred vision.   Patient is hypertensive in the emergency department today 194/78.  No neuro deficits, no head injury.  Denies any blurred vision currently.  Visual acuity exam with left eye 20/60, right eye 20/80 and bilateral 20/50..  Will give some hydralazine here and reevaluate.  EKG does show new A. Fib, rate controlled.  Patient's [place] vas score is  5, would recommend patient starting anticoagulation.  We will also refer to A. fib clinic.  Patient denies any chest pain or shortness of breath.  Did start patient on Eliquis, side effects discussed  with me and pharmacist.  In regards to patient's blurred vision, I think this is most likely allergic conjunctivitis from patient's head cold which I think is most likely allergies for the past 3 weeks.  Symptomatic treatment discussed in regards to this.  No red flag symptoms.  Patient follow-up with ophthalmology and PCP about this. No blurry vision while being here for 6 hours.   In regards to patient's leg pain, CMP with potassium of 2.3, most likely causing cramping sensation.  Will replete here.  Negative DVT study.  Patient is not having any chest pain.  CBC unremarkable, no leukocytosis.  No concerns for cellulitis.  Patient is distally neurovascularly intact with good peripheral pulses bilaterally.  Hemoglobin 10.9, patient's hemoglobin was 9.7 in 2021.  Upon reevaluation, patient's potassium repleted 2.7.  Upon reevaluation, blood pressure slightly better at 186/90, patient currently asymptomatic.  Shared decision making about patient coming in at this time due to this with new.A. fib with hypokalemia, however patient states that she wants to go home.  Discussed risk of this, patient to make own decisions at this time.  Will give more potassium here and have her follow-up with PCP in regards to patient's elevated blood pressure and blood pressure management, new A. fib started on Eliquis, low potassium most likely from HCTZ since patient does not have any losses, and have patient follow-up with ophthalmology.  Did discuss this in depth with patient and patient's family who was in the room.  Patient expressed understanding and repeated back to me what was going to occur.  Patient does have primary care appointment at 1 tomorrow.  Doubt need for further emergent work up at this time. I explained the diagnosis and have given explicit precautions to return to the ER including for any other new or worsening symptoms. The patient understands and accepts the medical plan as it's been dictated and I have  answered their questions. Discharge instructions concerning home care and prescriptions have been given. The patient is STABLE and is discharged to home in good condition.  I discussed this case with my attending physician who cosigned this note including patient's presenting symptoms, physical exam, and planned diagnostics and interventions. Attending physician stated agreement with plan or made changes to plan which were implemented.   Attending physician assessed patient at bedside.   Final Clinical Impression(s) / ED Diagnoses Final diagnoses:  Left leg pain  Atrial fibrillation, unspecified type (HCC)  Allergic conjunctivitis of both eyes    Rx / DC Orders ED Discharge Orders         Ordered    Amb Referral to AFIB Clinic        01/21/21 1138    apixaban (ELIQUIS) 5 MG TABS tablet  2 times daily,   Status:  Discontinued        01/21/21 1457    apixaban (ELIQUIS) 5 MG TABS tablet  2 times daily        01/21/21 1607    hydrALAZINE (APRESOLINE) 25 MG tablet  2 times daily PRN        01/21/21 1612    potassium chloride (KLOR-CON) 10 MEQ tablet  Daily        01/21/21 1612    fluticasone (FLONASE) 50 MCG/ACT nasal spray  Daily  01/21/21 1619           Farrel Gordon, PA-C 01/21/21 1626    Cheryll Cockayne, MD 01/26/21 1504

## 2021-01-21 NOTE — Telephone Encounter (Signed)
I called and spoke with both the patient and [name]. The patient is willing to go be evaluated at an urgent care -- I encouraged her to go ahead this morning, rather than wait until later in the day. Eunice Blase has said she will most likely take off from work and take her, to be sure that she does indeed go to be evaluated.

## 2021-01-21 NOTE — Telephone Encounter (Signed)
She should go to urgent care for this.  Needs more workup than we can provide.

## 2021-01-21 NOTE — ED Triage Notes (Addendum)
Left leg pain x 3- 4 days  , hurts to walk on it  crampy pain she states , has been using her heating pad  She states  Has had a bad cold x 3 weeks,  Has drainage left eye

## 2021-01-21 NOTE — Telephone Encounter (Signed)
Please advise 

## 2021-01-27 ENCOUNTER — Emergency Department (HOSPITAL_BASED_OUTPATIENT_CLINIC_OR_DEPARTMENT_OTHER): Payer: Medicare Other

## 2021-01-27 ENCOUNTER — Encounter (HOSPITAL_BASED_OUTPATIENT_CLINIC_OR_DEPARTMENT_OTHER): Payer: Self-pay | Admitting: Emergency Medicine

## 2021-01-27 ENCOUNTER — Other Ambulatory Visit: Payer: Self-pay

## 2021-01-27 ENCOUNTER — Emergency Department (HOSPITAL_BASED_OUTPATIENT_CLINIC_OR_DEPARTMENT_OTHER)
Admission: EM | Admit: 2021-01-27 | Discharge: 2021-01-27 | Disposition: A | Discharge status: Home / Self Care | Payer: Medicare Other | Attending: Physician Assistant | Admitting: Physician Assistant

## 2021-01-27 DIAGNOSIS — E119 Type 2 diabetes mellitus without complications: Secondary | ICD-10-CM | POA: Diagnosis not present

## 2021-01-27 DIAGNOSIS — J449 Chronic obstructive pulmonary disease, unspecified: Secondary | ICD-10-CM | POA: Insufficient documentation

## 2021-01-27 DIAGNOSIS — Z79899 Other long term (current) drug therapy: Secondary | ICD-10-CM | POA: Diagnosis not present

## 2021-01-27 DIAGNOSIS — Z7952 Long term (current) use of systemic steroids: Secondary | ICD-10-CM | POA: Insufficient documentation

## 2021-01-27 DIAGNOSIS — I10 Essential (primary) hypertension: Secondary | ICD-10-CM | POA: Insufficient documentation

## 2021-01-27 DIAGNOSIS — R52 Pain, unspecified: Secondary | ICD-10-CM

## 2021-01-27 DIAGNOSIS — F17210 Nicotine dependence, cigarettes, uncomplicated: Secondary | ICD-10-CM | POA: Insufficient documentation

## 2021-01-27 DIAGNOSIS — Z7984 Long term (current) use of oral hypoglycemic drugs: Secondary | ICD-10-CM | POA: Diagnosis not present

## 2021-01-27 DIAGNOSIS — M79605 Pain in left leg: Secondary | ICD-10-CM

## 2021-01-27 DIAGNOSIS — Z7901 Long term (current) use of anticoagulants: Secondary | ICD-10-CM | POA: Diagnosis not present

## 2021-01-27 LAB — POTASSIUM: Potassium: 3.5 mmol/L (ref 3.5–5.1)

## 2021-01-27 NOTE — Discharge Instructions (Addendum)
Your x-ray today did not show any evidence of fractures.  Potassium was normal today

## 2021-01-27 NOTE — ED Triage Notes (Addendum)
Seen previously for left upper leg pain.  DVT study negative.  Per step daughter the leg has gotten worse and gives out when she stands on it.  Followed up with PCP.  Family member wants [place] to check for a possible fracture.  Also concerned about BP medications previously prescribed here then changed at PCP.  Med list updated in triage.

## 2021-01-27 NOTE — ED Notes (Signed)
AVS provided to client and daughter in law, number for [facility] was provided. Pt assisted to POV via WC

## 2021-01-27 NOTE — ED Provider Notes (Signed)
[facility] EMERGENCY DEPARTMENT Provider Note   CSN: [id number] Arrival date & time: 01/27/21  1557     History Chief Complaint  Patient presents with  . Leg Pain    Stephanie Pace is a 80 y.o. female.  HPI 80 year old female who presents to the ER with left upper leg pain.  Seen here on 2/14 with similar complaint, had a DVT study which was negative.  She was found to be in new onset A. fib, started on Eliquis.  She was also found to be profoundly hypokalemic, she received potassium repletion and was told to discontinue her HCTZ was started on hydralazine.  She followed up with her primary care doctor several days later, who instructed her to resume the HCTZ and continue to take the hydralazine.  Daughter at bedside is very concerned about the fact that she was told to stop this medication in the ER.  She has been compliant with her potassium supplements.  Left leg pain with no worsening characteristics, states that this has been ongoing for quite some time.  She is reports virtually no pain at rest, worse with bearing weight and movement.  At times the leg has given out.  She has had more limited mobility secondary to this.  Denies any head injuries or LOC.  Family is concerned that she may have a fracture to those bones present to the ER requesting x-rays.    Past Medical History:  Diagnosis Date  . Diabetes mellitus without complication (HCC)   . Hypertension     Patient Active Problem List   Diagnosis Date Noted  . Hypokalemia 01/30/2015  . Type 2 diabetes mellitus without complication (HCC) 08/07/2014  . COPD (chronic obstructive pulmonary disease) (HCC) 07/11/2014  . Renal insufficiency 07/11/2014  . Sciatica 07/11/2014  . Essential hypertension 04/04/2014  . Generalized anxiety disorder 04/03/2014  . Hyperlipidemia LDL goal <100 04/03/2014  . Osteoporosis 04/03/2014    History reviewed. No pertinent surgical history.   OB History   No obstetric history on  file.     No family history on file.  Social History   Tobacco Use  . Smoking status: Current Every Day Smoker    Types: Cigarettes  . Smokeless tobacco: Never Used  Substance Use Topics  . Alcohol use: Never  . Drug use: Never    Home Medications Prior to Admission medications   Medication Sig Start Date End Date Taking? Authorizing Provider  apixaban (ELIQUIS) 5 MG TABS tablet Take 1 tablet (5 mg total) by mouth 2 (two) times daily. 01/21/21 02/20/21 Yes Patel, Shalyn, PA-C  atorvastatin (LIPITOR) 20 MG tablet Take by mouth. 06/22/17  Yes [provider]  calcium citrate-vitamin D (CITRACAL+D) 315-200 MG-UNIT tablet Take by mouth.   Yes [provider]  carvedilol (COREG) 25 MG tablet  08/18/18  Yes [provider]  fenofibrate 160 MG tablet  10/22/18  Yes [provider]  fluticasone (FLONASE) 50 MCG/ACT nasal spray Place 2 sprays into both nostrils daily for 7 doses. 01/21/21 01/28/21 Yes Patel, Shalyn, PA-C  hydrALAZINE (APRESOLINE) 25 MG tablet Take 1 tablet (25 mg total) by mouth 2 (two) times daily as needed. 01/21/21 02/20/21 Yes Farrel Gordon, PA-C  metFORMIN (GLUCOPHAGE) 500 MG tablet  01/27/19  Yes [provider]  pioglitazone (ACTOS) 30 MG tablet TAKE 1 TABLET BY MOUTH DAILY 07/13/17  Yes [provider]  potassium chloride (KLOR-CON) 10 MEQ tablet Take 2 tablets (20 mEq total) by mouth daily for 10 days.  01/21/21 01/31/21 Yes Patel, Shalyn, PA-C  valsartan-hydrochlorothiazide (DIOVAN-HCT) 320-25 MG tablet TK 1 T PO D 10/22/18  Yes [provider]  omeprazole (PRILOSEC OTC) 20 MG tablet Take by mouth. 04/04/14   [provider]  oxybutynin (DITROPAN-XL) 10 MG 24 hr tablet Take 10 mg by mouth at bedtime.    [provider]  tiZANidine (ZANAFLEX) 2 MG tablet Take 1-2 tablets (2-4 mg total) by mouth every 6 (six) hours as needed for muscle spasms. 02/08/19   Hilts, Casimiro Needle, MD    Allergies    Patient has  no known allergies.  Review of Systems   Review of Systems  Musculoskeletal: Positive for arthralgias and gait problem.  Skin: Negative for color change and wound.  Neurological: Negative for weakness and numbness.    Physical Exam Updated Vital Signs BP (!) 182/84 (BP Location: Right Arm)   Pulse 81   Temp 98.2 F (36.8 C) (Oral)   Resp 18   Ht 5\' 4"  (1.626 m)   Wt 77.6 kg   SpO2 95%   BMI 29.37 kg/m   Physical Exam Vitals and nursing note reviewed.  Constitutional:      General: She is not in acute distress.    Appearance: She is well-developed and well-nourished. She is not ill-appearing or diaphoretic.  HENT:     Head: Normocephalic and atraumatic.  Eyes:     Conjunctiva/sclera: Conjunctivae normal.  Cardiovascular:     Rate and Rhythm: Normal rate and regular rhythm.     Heart sounds: No murmur heard.   Pulmonary:     Effort: Pulmonary effort is normal. No respiratory distress.     Breath sounds: Normal breath sounds.  Abdominal:     Palpations: Abdomen is soft.     Tenderness: There is no abdominal tenderness.  Musculoskeletal:        General: No swelling, tenderness, deformity or signs of injury.     Cervical back: Neck supple.     Right lower leg: Edema present.     Left lower leg: Edema present.     Comments: Left leg with 1+ nonpitting edema.  No significant erythema.  She has 5/5 strength in both extremities bilaterally, full flexion extension of the left and right hip bilaterally.  Skin:    General: Skin is warm and dry.  Neurological:     General: No focal deficit present.     Mental Status: She is alert and oriented to person, place, and time.     Sensory: No sensory deficit.     Motor: No weakness.  Psychiatric:        Mood and Affect: Mood and affect and mood normal.        Behavior: Behavior normal.     ED Results / Procedures / Treatments   Labs (all labs ordered are listed, but only abnormal results are displayed) Labs Reviewed   POTASSIUM    EKG None  Radiology DG Pelvis 1-2 Views  Result Date: 01/27/2021 CLINICAL DATA:  Multiple falls over the past 2 weeks. Pelvic pain. Initial encounter. EXAM: PELVIS - 1-2 VIEW COMPARISON:  None. FINDINGS: There is no evidence of pelvic fracture or diastasis. No pelvic bone lesions are seen. IMPRESSION: Negative exam. Electronically Signed   By: 01/29/2021 M.D.   On: 01/27/2021 17:13   DG FEMUR MIN 2 VIEWS LEFT  Result Date: 01/27/2021 CLINICAL DATA:  Multiple falls over the past 2 weeks. Left upper leg pain. Initial encounter. EXAM: LEFT FEMUR 2  VIEWS COMPARISON:  None. FINDINGS: There is no evidence of fracture or other focal bone lesions. Soft tissues are unremarkable. IMPRESSION: Negative exam. Electronically Signed   By: Drusilla Kanner M.D.   On: 01/27/2021 17:13    Procedures Procedures   Medications Ordered in ED Medications - No data to display  ED Course  I have reviewed the triage vital signs and the nursing notes.  Pertinent labs & imaging results that were available during my care of the patient were reviewed by me and considered in my medical decision making (see chart for details).    MDM Rules/Calculators/A&P                          80 year old female who presents to the ER with left leg pain which has been ongoing for several weeks.  She was prior evaluated here in the ER for this as well.  She had a DVT study which was negative.  She was found to be new onset A. fib, started on Eliquis which she has been compliant with.  Left leg exam overall benign, she has some 1+ nonpitting left lower extremity edema which appears to be chronic per the patient.  Plain films of her pelvis and left femur without any abnormalities.  Unclear cause of her pain, no definitive fractures or dislocations.  Patient and her daughter were concerned about blood pressure medication regimens given they were told to stop the HCTZ here in the ER, but were told to resume this  at her primary care doctor's office.  Patient has been taking the hydralazine and HCTZ per PCP.  I explained to the patient that it was discontinued secondary to her low potassium, patient has been compliant with her potassium supplements since this ER visit.  I encouraged her to continue her PCPs regimen, was happy to check a potassium.  This was normal.  I referred her back to her PCP, she may benefit from some physical therapy or possible further orthopedic evaluation.  No evidence of fractures, dislocations, low suspicion for septic joint.  Return precautions discussed patient and daughter voiced understanding and are agreeable to this plan.  Stable for discharge.  This was a shared visit with my supervising physician Dr. Anitra Lauth  who independently saw and evaluated the patient & provided guidance in evaluation/management/disposition ,in agreement with care  Final Clinical Impression(s) / ED Diagnoses Final diagnoses:  Left leg pain    Rx / DC Orders ED Discharge Orders    None       Leone Brand 01/27/21 1833    Gwyneth Sprout, MD 01/30/21 [phone]

## 2021-01-30 ENCOUNTER — Ambulatory Visit (HOSPITAL_COMMUNITY)
Admission: RE | Admit: 2021-01-30 | Discharge: 2021-01-30 | Disposition: A | Discharge status: Home / Self Care | Payer: Medicare Other | Source: Ambulatory Visit | Attending: Physician Assistant | Admitting: Physician Assistant

## 2021-01-30 ENCOUNTER — Encounter (HOSPITAL_COMMUNITY): Payer: Self-pay | Admitting: Physician Assistant

## 2021-01-30 ENCOUNTER — Other Ambulatory Visit: Payer: Self-pay

## 2021-01-30 VITALS — BP 160/88 | HR 86 | Ht 64.0 in | Wt 168.4 lb

## 2021-01-30 DIAGNOSIS — D6869 Other thrombophilia: Secondary | ICD-10-CM | POA: Insufficient documentation

## 2021-01-30 DIAGNOSIS — Z7901 Long term (current) use of anticoagulants: Secondary | ICD-10-CM | POA: Insufficient documentation

## 2021-01-30 DIAGNOSIS — I10 Essential (primary) hypertension: Secondary | ICD-10-CM | POA: Diagnosis not present

## 2021-01-30 DIAGNOSIS — I4819 Other persistent atrial fibrillation: Secondary | ICD-10-CM | POA: Diagnosis present

## 2021-01-30 DIAGNOSIS — J449 Chronic obstructive pulmonary disease, unspecified: Secondary | ICD-10-CM | POA: Diagnosis not present

## 2021-01-30 DIAGNOSIS — Z79899 Other long term (current) drug therapy: Secondary | ICD-10-CM | POA: Insufficient documentation

## 2021-01-30 DIAGNOSIS — F17210 Nicotine dependence, cigarettes, uncomplicated: Secondary | ICD-10-CM | POA: Diagnosis not present

## 2021-01-30 DIAGNOSIS — I4891 Unspecified atrial fibrillation: Secondary | ICD-10-CM | POA: Insufficient documentation

## 2021-01-30 NOTE — Progress Notes (Signed)
Primary Care Physician: Shellia Cleverly, PA Primary Cardiologist: none Primary Electrophysiologist: none Referring Physician: [facility]   Stephanie Pace is a 80 y.o. female with a history of diabetes, hypertension, COPD, sciatica, renal insufficiency, osteoporosis, generalized anxiety disorder, and atrial fibrillation who presents for consultation in the [facility]. The patient was initially diagnosed with atrial fibrillation 01/21/21 incidentally after presenting to the ED with leg pain. Patient was started on Eliquis for a CHADS2VASC score of 5. She denies any awareness of her arrhythmia.   Today, she denies symptoms of palpitations, chest pain, shortness of breath, orthopnea, PND, lower extremity edema, dizziness, presyncope, syncope, snoring, daytime somnolence, bleeding, or neurologic sequela. The patient is tolerating medications without difficulties and is otherwise without complaint today.    Atrial Fibrillation Risk Factors:  she does not have symptoms or diagnosis of sleep apnea. she does not have a history of rheumatic fever. The patient does not have a history of early familial atrial fibrillation or other arrhythmias.  she has a BMI of Body mass index is 28.91 kg/m.Marland Kitchen Filed Weights   01/30/21 1419  Weight: 76.4 kg    No family history on file.   Atrial Fibrillation Management history:  Previous antiarrhythmic drugs: none Previous cardioversions: none Previous ablations: none CHADS2VASC score: 5 Anticoagulation history: Eliquis   Past Medical History:  Diagnosis Date  . Diabetes mellitus without complication (HCC)   . Hypertension    No past surgical history on file.  Current Outpatient Medications  Medication Sig Dispense Refill  . apixaban (ELIQUIS) 5 MG TABS tablet Take 1 tablet (5 mg total) by mouth 2 (two) times daily. 60 tablet 0  . atorvastatin (LIPITOR) 20 MG tablet Take by mouth.    . calcium citrate-vitamin D  (CITRACAL+D) 315-200 MG-UNIT tablet Take by mouth.    . carvedilol (COREG) 25 MG tablet     . fenofibrate 160 MG tablet     . hydrALAZINE (APRESOLINE) 25 MG tablet Take 1 tablet (25 mg total) by mouth 2 (two) times daily as needed. 30 tablet 0  . MAG-G 500 (27 Mg) MG TABS SMARTSIG:1 Tablet(s) By Mouth    . metFORMIN (GLUCOPHAGE) 500 MG tablet     . oxybutynin (DITROPAN-XL) 10 MG 24 hr tablet Take 10 mg by mouth at bedtime.    . pantoprazole (PROTONIX) 40 MG tablet Take 40 mg by mouth daily.    . pioglitazone (ACTOS) 30 MG tablet TAKE 1 TABLET BY MOUTH DAILY    . potassium chloride (KLOR-CON) 10 MEQ tablet Take 2 tablets (20 mEq total) by mouth daily for 10 days. 20 tablet 0  . valsartan-hydrochlorothiazide (DIOVAN-HCT) 320-25 MG tablet TK 1 T PO D    . fluticasone (FLONASE) 50 MCG/ACT nasal spray Place 2 sprays into both nostrils daily for 7 doses. 11.1 mL 0   No current facility-administered medications for this encounter.    No Known Allergies  Social History   Socioeconomic History  . Marital status: Married    Spouse name: Not on file  . Number of children: Not on file  . Years of education: Not on file  . Highest education level: Not on file  Occupational History  . Not on file  Tobacco Use  . Smoking status: Current Every Day Smoker    Types: Cigarettes  . Smokeless tobacco: Never Used  . Tobacco comment: 1 pack daily  Substance and Sexual Activity  . Alcohol use: Yes    Alcohol/week: 1.0  standard drink    Types: 1 Cans of beer per week  . Drug use: Never  . Sexual activity: Not on file  Other Topics Concern  . Not on file  Social History Narrative  . Not on file   Social Determinants of Health   Financial Resource Strain: Not on file  Food Insecurity: Not on file  Transportation Needs: Not on file  Physical Activity: Not on file  Stress: Not on file  Social Connections: Not on file  Intimate Partner Violence: Not on file     ROS- All systems are reviewed  and negative except as per the HPI above.  Physical Exam: Vitals:   01/30/21 1419  BP: (!) 160/88  Pulse: 86  Weight: 76.4 kg  Height: 5\' 4"  (1.626 m)    GEN- The patient is well appearing elderly female, alert and oriented x 3 today.   Head- normocephalic, atraumatic Eyes-  Sclera clear, conjunctiva pink Ears- hearing intact Oropharynx- clear Neck- supple  Lungs- Clear to ausculation bilaterally, normal work of breathing Heart- irregular rate and rhythm, no murmurs, rubs or gallops  GI- soft, NT, ND, + BS Extremities- no clubbing, cyanosis, or edema MS- no significant deformity or atrophy Skin- no rash or lesion Psych- euthymic mood, full affect Neuro- strength and sensation are intact  Wt Readings from Last 3 Encounters:  01/30/21 76.4 kg  01/27/21 77.6 kg  01/21/21 77.6 kg    EKG today demonstrates  Afib Vent. rate 86 BPM QRS duration 82 ms QT/QTc 382/457 ms  Epic records are reviewed at length today  CHA2DS2-VASc Score = 5  The patient's score is based upon: CHF History: No HTN History: Yes Diabetes History: Yes Stroke History: No Vascular Disease History: No Age Score: 2 Gender Score: 1      ASSESSMENT AND PLAN: 1. Persistent Atrial Fibrillation (ICD10:  I48.19) The patient's CHA2DS2-VASc score is 5, indicating a 7.2% annual risk of stroke.   General education about afib provided and questions answered. We also discussed her stroke risk and the risks and benefits of anticoagulation. She is rate controlled and unaware of her arrhythmia. We discussed rate vs rhythm control. She would like a conservative approach with rate control. Check echocardiogram Continue Eliquis 5 mg BID Continue Coreg 25 mg BID  2. Secondary Hypercoagulable State (ICD10:  D68.69) The patient is at significant risk for stroke/thromboembolism based upon her CHA2DS2-VASc Score of 5.  Continue Apixaban (Eliquis).   3. HTN Elevated today, valsartan-HCTZ recently adjusted by PCP.  No changes today.   Follow up in the AF clinic in 3-4 weeks.   01/23/21 PA-C [facility] [address] [place], [place] [phone] 01/30/2021 2:24 PM

## 2021-02-19 ENCOUNTER — Ambulatory Visit (HOSPITAL_COMMUNITY): Payer: Medicare Other | Admitting: Physician Assistant

## 2021-02-19 ENCOUNTER — Ambulatory Visit (HOSPITAL_COMMUNITY): Payer: Medicare Other

## 2021-03-19 ENCOUNTER — Ambulatory Visit (HOSPITAL_BASED_OUTPATIENT_CLINIC_OR_DEPARTMENT_OTHER)
Admission: RE | Admit: 2021-03-19 | Discharge: 2021-03-19 | Disposition: A | Discharge status: Home / Self Care | Payer: Medicare Other | Source: Ambulatory Visit | Attending: Physician Assistant | Admitting: Physician Assistant

## 2021-03-19 ENCOUNTER — Encounter (HOSPITAL_COMMUNITY): Payer: Self-pay | Admitting: Physician Assistant

## 2021-03-19 ENCOUNTER — Other Ambulatory Visit: Payer: Self-pay

## 2021-03-19 ENCOUNTER — Ambulatory Visit (HOSPITAL_COMMUNITY)
Admission: RE | Admit: 2021-03-19 | Discharge: 2021-03-19 | Disposition: A | Discharge status: Home / Self Care | Payer: Medicare Other | Source: Ambulatory Visit | Attending: Physician Assistant | Admitting: Physician Assistant

## 2021-03-19 VITALS — BP 160/90 | HR 77 | Ht 64.0 in | Wt 183.0 lb

## 2021-03-19 DIAGNOSIS — Z7901 Long term (current) use of anticoagulants: Secondary | ICD-10-CM | POA: Diagnosis not present

## 2021-03-19 DIAGNOSIS — D6869 Other thrombophilia: Secondary | ICD-10-CM | POA: Insufficient documentation

## 2021-03-19 DIAGNOSIS — M5430 Sciatica, unspecified side: Secondary | ICD-10-CM | POA: Diagnosis not present

## 2021-03-19 DIAGNOSIS — M810 Age-related osteoporosis without current pathological fracture: Secondary | ICD-10-CM | POA: Diagnosis not present

## 2021-03-19 DIAGNOSIS — I4819 Other persistent atrial fibrillation: Secondary | ICD-10-CM

## 2021-03-19 DIAGNOSIS — J449 Chronic obstructive pulmonary disease, unspecified: Secondary | ICD-10-CM | POA: Diagnosis not present

## 2021-03-19 DIAGNOSIS — N289 Disorder of kidney and ureter, unspecified: Secondary | ICD-10-CM | POA: Diagnosis not present

## 2021-03-19 DIAGNOSIS — F17210 Nicotine dependence, cigarettes, uncomplicated: Secondary | ICD-10-CM | POA: Diagnosis not present

## 2021-03-19 DIAGNOSIS — Z7984 Long term (current) use of oral hypoglycemic drugs: Secondary | ICD-10-CM | POA: Diagnosis not present

## 2021-03-19 DIAGNOSIS — I083 Combined rheumatic disorders of mitral, aortic and tricuspid valves: Secondary | ICD-10-CM | POA: Insufficient documentation

## 2021-03-19 DIAGNOSIS — E119 Type 2 diabetes mellitus without complications: Secondary | ICD-10-CM | POA: Insufficient documentation

## 2021-03-19 DIAGNOSIS — I10 Essential (primary) hypertension: Secondary | ICD-10-CM | POA: Insufficient documentation

## 2021-03-19 DIAGNOSIS — F411 Generalized anxiety disorder: Secondary | ICD-10-CM | POA: Insufficient documentation

## 2021-03-19 DIAGNOSIS — Z79899 Other long term (current) drug therapy: Secondary | ICD-10-CM | POA: Insufficient documentation

## 2021-03-19 LAB — BASIC METABOLIC PANEL
Anion gap: 7 (ref 5–15)
BUN: 11 mg/dL (ref 8–23)
CO2: 32 mmol/L (ref 22–32)
Calcium: 8.9 mg/dL (ref 8.9–10.3)
Chloride: 93 mmol/L — ABNORMAL LOW (ref 98–111)
Creatinine, Ser: 0.66 mg/dL (ref 0.44–1.00)
GFR, Estimated: >60 mL/min (ref >60)
Glucose, Bld: 117 mg/dL — ABNORMAL HIGH (ref 70–99)
Potassium: 2.9 mmol/L — ABNORMAL LOW (ref 3.5–5.1)
Sodium: 132 mmol/L — ABNORMAL LOW (ref 135–145)

## 2021-03-19 LAB — CBC
HCT: 27.1 % — ABNORMAL LOW (ref 36.0–46.0)
Hemoglobin: 8.6 g/dL — ABNORMAL LOW (ref 12.0–15.0)
MCH: 29.6 pg (ref 26.0–34.0)
MCHC: 31.7 g/dL (ref 30.0–36.0)
MCV: 93.1 fL (ref 80.0–100.0)
Platelets: 290 10*3/uL (ref 150–400)
RBC: 2.91 MIL/uL — ABNORMAL LOW (ref 3.87–5.11)
RDW: 20.1 % — ABNORMAL HIGH (ref 11.5–15.5)
WBC: 5.2 10*3/uL (ref 4.0–10.5)
nRBC: 0 % (ref 0.0–0.2)

## 2021-03-19 LAB — ECHOCARDIOGRAM COMPLETE: S' Lateral: 3.6 cm

## 2021-03-19 NOTE — Progress Notes (Signed)
Primary Care Physician: Shellia Cleverly, PA Primary Cardiologist: none Primary Electrophysiologist: none Referring Physician: [facility]   Stephanie Pace is a 80 y.o. female with a history of diabetes, hypertension, COPD, sciatica, renal insufficiency, osteoporosis, generalized anxiety disorder, and atrial fibrillation who presents for follow up in the [facility]. The patient was initially diagnosed with atrial fibrillation 01/21/21 incidentally after presenting to the ED with leg pain. Patient was started on Eliquis for a CHADS2VASC score of 5. She denies any awareness of her arrhythmia.   On follow up today, patient reports she has done well since her last visit. She denies any symptoms with her arrhythmia. She also denies any bleeding issues on anticoagulation. She had an echo today and her results are pending. Her primary concern is her leg pain, she sees ortho later this week.   Today, she denies symptoms of palpitations, chest pain, shortness of breath, orthopnea, PND, lower extremity edema, dizziness, presyncope, syncope, snoring, daytime somnolence, bleeding, or neurologic sequela. The patient is tolerating medications without difficulties and is otherwise without complaint today.    Atrial Fibrillation Risk Factors:  she does not have symptoms or diagnosis of sleep apnea. she does not have a history of rheumatic fever. The patient does not have a history of early familial atrial fibrillation or other arrhythmias.  she has a BMI of Body mass index is 31.41 kg/m.Marland Kitchen Filed Weights   03/19/21 1510  Weight: 83 kg    No family history on file.   Atrial Fibrillation Management history:  Previous antiarrhythmic drugs: none Previous cardioversions: none Previous ablations: none CHADS2VASC score: 5 Anticoagulation history: Eliquis   Past Medical History:  Diagnosis Date  . Diabetes mellitus without complication (HCC)   . Hypertension    No past  surgical history on file.  Current Outpatient Medications  Medication Sig Dispense Refill  . acetaminophen (TYLENOL) 500 MG tablet Take 500 mg by mouth every 6 (six) hours as needed for mild pain or moderate pain.    Marland Kitchen apixaban (ELIQUIS) 5 MG TABS tablet TAKE 1 TABLET (5 MG TOTAL) BY MOUTH 2 (TWO) TIMES DAILY. 60 tablet 0  . atorvastatin (LIPITOR) 20 MG tablet Take by mouth.    . calcium citrate-vitamin D (CITRACAL+D) 315-200 MG-UNIT tablet Take by mouth.    . carvedilol (COREG) 25 MG tablet     . erythromycin ophthalmic ointment SMARTSIG:Sparingly In Eye(s) 4 Times Daily    . fenofibrate 160 MG tablet     . fluticasone (FLONASE) 50 MCG/ACT nasal spray Place 2 sprays into both nostrils daily for 7 doses. 11.1 mL 0  . hydrALAZINE (APRESOLINE) 25 MG tablet Take 1 tablet (25 mg total) by mouth 2 (two) times daily as needed. 30 tablet 0  . MAG-G 500 (27 Mg) MG TABS SMARTSIG:1 Tablet(s) By Mouth    . metFORMIN (GLUCOPHAGE) 500 MG tablet     . moxifloxacin (VIGAMOX) 0.5 % ophthalmic solution Apply 1 drop to eye 4 (four) times daily.    Marland Kitchen oxybutynin (DITROPAN-XL) 10 MG 24 hr tablet Take 10 mg by mouth at bedtime.    . pantoprazole (PROTONIX) 40 MG tablet Take 40 mg by mouth daily.    . pioglitazone (ACTOS) 30 MG tablet TAKE 1 TABLET BY MOUTH DAILY    . valACYclovir (VALTREX) 1000 MG tablet Take 1,000 mg by mouth 3 (three) times daily.    . valsartan-hydrochlorothiazide (DIOVAN-HCT) 320-25 MG tablet TK 1 T PO D    . apixaban (  ELIQUIS) 5 MG TABS tablet Take 1 tablet (5 mg total) by mouth 2 (two) times daily. 60 tablet 0  . potassium chloride (KLOR-CON) 10 MEQ tablet Take 2 tablets (20 mEq total) by mouth daily for 10 days. 20 tablet 0   No current facility-administered medications for this encounter.    No Known Allergies  Social History   Socioeconomic History  . Marital status: Married    Spouse name: Not on file  . Number of children: Not on file  . Years of education: Not on file  .  Highest education level: Not on file  Occupational History  . Not on file  Tobacco Use  . Smoking status: Current Every Day Smoker    Types: Cigarettes  . Smokeless tobacco: Never Used  . Tobacco comment: 1 pack daily  Substance and Sexual Activity  . Alcohol use: Yes    Alcohol/week: 1.0 standard drink    Types: 1 Cans of beer per week  . Drug use: Never  . Sexual activity: Not on file  Other Topics Concern  . Not on file  Social History Narrative  . Not on file   Social Determinants of Health   Financial Resource Strain: Not on file  Food Insecurity: Not on file  Transportation Needs: Not on file  Physical Activity: Not on file  Stress: Not on file  Social Connections: Not on file  Intimate Partner Violence: Not on file     ROS- All systems are reviewed and negative except as per the HPI above.  Physical Exam: Vitals:   03/19/21 1510  BP: (!) 160/90  Pulse: 77  Weight: 83 kg  Height: 5\' 4"  (1.626 m)    GEN- The patient is a well appearing obese elderly female, alert and oriented x 3 today.   HEENT-head normocephalic, atraumatic, sclera clear, conjunctiva pink, hearing intact, trachea midline. Lungs- Clear to ausculation bilaterally, normal work of breathing Heart- irregular rate and rhythm, no murmurs, rubs or gallops  GI- soft, NT, ND, + BS Extremities- no clubbing, cyanosis, or edema MS- no significant deformity or atrophy Skin- no rash or lesion Psych- euthymic mood, full affect Neuro- strength and sensation are intact   Wt Readings from Last 3 Encounters:  03/19/21 83 kg  01/30/21 76.4 kg  01/27/21 77.6 kg    EKG today demonstrates  Afib Vent. rate 77 BPM PR interval * ms QRS duration 86 ms QT/QTcB 404/457 ms  Epic records are reviewed at length today  CHA2DS2-VASc Score = 5  The patient's score is based upon: CHF History: No HTN History: Yes Diabetes History: Yes Stroke History: No Vascular Disease History: No Age Score: 2 Gender  Score: 1      ASSESSMENT AND PLAN: 1. Persistent Atrial Fibrillation (ICD10:  I48.19) The patient's CHA2DS2-VASc score is 5, indicating a 7.2% annual risk of stroke.   Patient remains in rate controlled afib. She is unaware of her arrhythmia. Recall she would like a conservative approach with rate control. Echocardiogram pending. Continue Eliquis 5 mg BID Continue Coreg 25 mg BID  2. Secondary Hypercoagulable State (ICD10:  D68.69) The patient is at significant risk for stroke/thromboembolism based upon her CHA2DS2-VASc Score of 5.  Continue Apixaban (Eliquis).   3. HTN Still elevated today, patient has follow up with PCP to discuss.    Follow up in the AF clinic in 3 months.    01/29/21 PA-C Afib Clinic [facility] [address] [place], [place] [phone] 03/19/2021 3:16  PM

## 2021-03-19 NOTE — Progress Notes (Signed)
  Echocardiogram 2D Echocardiogram has been performed.  Stephanie Pace 03/19/2021, 3:06 PM

## 2021-03-20 ENCOUNTER — Other Ambulatory Visit (HOSPITAL_COMMUNITY): Payer: Self-pay | Admitting: *Deleted

## 2021-03-20 MED ORDER — POTASSIUM CHLORIDE ER 10 MEQ PO TBCR: 20.0000 meq | EXTENDED_RELEASE_TABLET | Freq: Every day | ORAL | 3 refills | Status: DC

## 2021-03-21 ENCOUNTER — Ambulatory Visit: Payer: Self-pay

## 2021-03-21 ENCOUNTER — Ambulatory Visit: Payer: Medicare Other | Admitting: Orthopedic Surgery

## 2021-03-21 ENCOUNTER — Encounter: Payer: Self-pay | Admitting: Orthopedic Surgery

## 2021-03-21 DIAGNOSIS — M79605 Pain in left leg: Secondary | ICD-10-CM

## 2021-03-21 DIAGNOSIS — I872 Venous insufficiency (chronic) (peripheral): Secondary | ICD-10-CM | POA: Diagnosis not present

## 2021-03-21 DIAGNOSIS — M545 Low back pain, unspecified: Secondary | ICD-10-CM | POA: Diagnosis not present

## 2021-03-21 NOTE — Progress Notes (Signed)
Office Visit Note   Patient: Stephanie Pace           Date of Birth: Jan 19, 1941           MRN: [id number] Visit Date: 03/21/2021              Requested by: Shellia Cleverly, PA [address] [place],  [place] [address] PCP: Shellia Cleverly, [place]  Chief Complaint  Patient presents with  . Left Leg - Pain      HPI: Patient is a 80 year old woman who presents with over 2-year history of lower back pain with radicular symptoms down the anterior aspect of the left thigh radiating to the lateral aspect of the left calf.  Patient states she has weakness to try to get in and out of a car and has to lift her leg.  States she has had episodes of her leg giving out.  She denies any syncopal events.  She denies any knee pain.  Patient states she is fallen secondary to her leg giving out.  Patient is on Eliquis for A. fib.  Patient states she is recently had an EKG has been diagnosed with low potassium currently taking a supplement as well as low hemoglobin.  She is currently on Metformin for her diabetes.  Assessment & Plan: Visit Diagnoses:  1. Low back pain radiating to left leg   2. Pain in left leg   3. Venous insufficiency of both lower extremities     Plan: Due to failure prolonged conservative therapy we will plan for an MRI scan of her lumbar spine with the possibility of an epidural steroid injection.  Will hold on oral prednisone secondary to her diabetes.  Follow-Up Instructions: Return if symptoms worsen or fail to improve, for Follow-up after MRI scan lumbar spine.   Ortho Exam  Patient is alert, oriented, no adenopathy, well-dressed, normal affect, normal respiratory effort. Examination patient has a negative straight leg raise bilaterally she has no pain with range of motion the hip knee or ankle she has good motor strength in all motor groups of the left lower extremity and hip flexion strength is good.  Imaging: XR Lumbar Spine 2-3 Views  Result Date: 03/21/2021 2 view radiographs of  the lumbar spine shows degenerative disc disease with osteophytic bone spurs joint space narrowing and some calcification of the aorta.  No images are attached to the encounter.  Labs: No results found for: HGBA1C, ESRSEDRATE, CRP, LABURIC, REPTSTATUS, GRAMSTAIN, CULT, LABORGA   Lab Results  Component Value Date   ALBUMIN 3.5 01/21/2021    Lab Results  Component Value Date   MG 1.5 (L) 01/21/2021   MG 1.8 02/03/2019   No results found for: VD25OH  No results found for: PREALBUMIN CBC EXTENDED Latest Ref Rng & Units 03/19/2021 01/21/2021 02/03/2019  WBC 4.0 - 10.5 K/uL 5.2 6.6 5.7  RBC 3.87 - 5.11 MIL/uL 2.91(L) 4.03 4.18  HGB 12.0 - 15.0 g/dL 2.4(Q) 10.9(L) 12.0  HCT 36.0 - 46.0 % 27.1(L) 35.0(L) 38.3  PLT 150 - 400 K/uL 290 367 291  NEUTROABS 1.7 - 7.7 K/uL - 4.8 4.0  LYMPHSABS 0.7 - 4.0 K/uL - 1.1 1.0     There is no height or weight on file to calculate BMI.  Orders:  Orders Placed This Encounter  Procedures  . XR Lumbar Spine 2-3 Views  . MR Lumbar Spine w/o contrast   No orders of the defined types were placed in this encounter.  Procedures: No procedures performed  Clinical Data: No additional findings.  ROS:  All other systems negative, except as noted in the HPI. Review of Systems  Objective: Vital Signs: There were no vitals taken for this visit.  Specialty Comments:  No specialty comments available.  PMFS History: Patient Active Problem List   Diagnosis Date Noted  . Persistent atrial fibrillation (HCC) 01/30/2021  . Secondary hypercoagulable state (HCC) 01/30/2021  . Hypokalemia 01/30/2015  . Type 2 diabetes mellitus without complication (HCC) 08/07/2014  . COPD (chronic obstructive pulmonary disease) (HCC) 07/11/2014  . Renal insufficiency 07/11/2014  . Sciatica 07/11/2014  . Essential hypertension 04/04/2014  . Generalized anxiety disorder 04/03/2014  . Hyperlipidemia LDL goal <100 04/03/2014  . Osteoporosis 04/03/2014   Past  Medical History:  Diagnosis Date  . Diabetes mellitus without complication (HCC)   . Hypertension     History reviewed. No pertinent family history.  History reviewed. No pertinent surgical history. Social History   Occupational History  . Not on file  Tobacco Use  . Smoking status: Current Every Day Smoker    Types: Cigarettes  . Smokeless tobacco: Never Used  . Tobacco comment: 1 pack daily  Substance and Sexual Activity  . Alcohol use: Yes    Alcohol/week: 1.0 standard drink    Types: 1 Cans of beer per week  . Drug use: Never  . Sexual activity: Not on file

## 2021-03-29 ENCOUNTER — Other Ambulatory Visit (HOSPITAL_COMMUNITY): Payer: Self-pay | Admitting: *Deleted

## 2021-03-29 MED ORDER — APIXABAN 5 MG PO TABS: 5.0000 mg | ORAL_TABLET | Freq: Two times a day (BID) | ORAL | 3 refills | Status: DC

## 2021-04-06 ENCOUNTER — Other Ambulatory Visit: Payer: Self-pay

## 2021-04-06 ENCOUNTER — Ambulatory Visit (HOSPITAL_BASED_OUTPATIENT_CLINIC_OR_DEPARTMENT_OTHER)
Admission: RE | Admit: 2021-04-06 | Discharge: 2021-04-06 | Disposition: A | Discharge status: Home / Self Care | Payer: Medicare Other | Source: Ambulatory Visit | Attending: Orthopedic Surgery | Admitting: Orthopedic Surgery

## 2021-04-06 DIAGNOSIS — M545 Low back pain, unspecified: Secondary | ICD-10-CM | POA: Insufficient documentation

## 2021-04-06 DIAGNOSIS — M79605 Pain in left leg: Secondary | ICD-10-CM | POA: Diagnosis present

## 2021-04-24 ENCOUNTER — Telehealth: Payer: Self-pay | Admitting: Orthopedic Surgery

## 2021-04-24 NOTE — Telephone Encounter (Signed)
Spoke with patient;s husband. Patient would like a call with the results of her MRI. The number to contact patient is [phone]

## 2021-04-25 ENCOUNTER — Other Ambulatory Visit: Payer: Self-pay | Admitting: Physician Assistant

## 2021-04-25 DIAGNOSIS — M79605 Pain in left leg: Secondary | ICD-10-CM

## 2021-04-25 DIAGNOSIS — M545 Low back pain, unspecified: Secondary | ICD-10-CM

## 2021-04-25 NOTE — Telephone Encounter (Signed)
Pt is s/p MRI L spine 04/08/21 and wants to discuss the results. Can you please call? Referral to [facility]  for ESI or to [facility] [place], [facility]?

## 2021-04-25 NOTE — Telephone Encounter (Signed)
I spoke with the patient by phone today.  Based on her MRI of her lumbar spine I did explain to her that I thought she would do well with a referral to to Dr. Alvester Morin and possible epidural steroid injection.  She says her pain is getting a bit more consistent night and day and she would like to go forward with this if possible

## 2021-05-02 ENCOUNTER — Telehealth: Payer: Self-pay | Admitting: Physical Medicine and Rehabilitation

## 2021-05-02 NOTE — Telephone Encounter (Signed)
Patient needs to schedule an interlaminar epidural steroid injection with Dr. Alvester Morin at [facility]. She will need to hold her Eliquis for 2 days prior to the injection. Will this be ok?

## 2021-05-02 NOTE — Telephone Encounter (Signed)
For spinal injections OK to hold Eliquis 3 days prior. No need for bridging.

## 2021-05-14 ENCOUNTER — Encounter: Payer: Self-pay | Admitting: Physical Medicine and Rehabilitation

## 2021-05-14 ENCOUNTER — Ambulatory Visit (INDEPENDENT_AMBULATORY_CARE_PROVIDER_SITE_OTHER): Payer: Medicare Other | Admitting: Physical Medicine and Rehabilitation

## 2021-05-14 ENCOUNTER — Ambulatory Visit: Payer: Self-pay

## 2021-05-14 ENCOUNTER — Other Ambulatory Visit: Payer: Self-pay

## 2021-05-14 VITALS — BP 135/85 | HR 66

## 2021-05-14 DIAGNOSIS — M47816 Spondylosis without myelopathy or radiculopathy, lumbar region: Secondary | ICD-10-CM

## 2021-05-14 DIAGNOSIS — M5116 Intervertebral disc disorders with radiculopathy, lumbar region: Secondary | ICD-10-CM

## 2021-05-14 DIAGNOSIS — M5416 Radiculopathy, lumbar region: Secondary | ICD-10-CM

## 2021-05-14 DIAGNOSIS — M5441 Lumbago with sciatica, right side: Secondary | ICD-10-CM

## 2021-05-14 DIAGNOSIS — M5442 Lumbago with sciatica, left side: Secondary | ICD-10-CM

## 2021-05-14 DIAGNOSIS — G8929 Other chronic pain: Secondary | ICD-10-CM

## 2021-05-14 MED ORDER — BETAMETHASONE SOD PHOS & ACET 6 (3-3) MG/ML IJ SUSP
12.0000 mg | Freq: Once | INTRAMUSCULAR | Status: AC
  Administered 2021-05-14: 12 mg

## 2021-05-14 NOTE — Progress Notes (Signed)
Pt state lower back pain that travels to both legs. Pt state. Pt state when she laying down ans sitting for a long time makes the pain worse. Pt state she take pain meds to help ease her pain.  Numeric Pain Rating Scale and Functional Assessment Average Pain 7   In the last MONTH (on 0-10 scale) has pain interfered with the following?  1. General activity like being  able to carry out your everyday physical activities such as walking, climbing stairs, carrying groceries, or moving a chair?  Rating(9)   +Driver, +BT, -Dye Allergies.

## 2021-05-16 ENCOUNTER — Encounter: Payer: Self-pay | Admitting: Physical Medicine and Rehabilitation

## 2021-05-16 NOTE — Progress Notes (Signed)
Stephanie Pace - 80 y.o. female MRN [id number]  Date of birth: 03/30/1941  Office Visit Note: Visit Date: 05/14/2021 PCP: Shellia Cleverly, PA Referred by: Shellia Cleverly, PA  Subjective: Chief Complaint  Patient presents with   Lower Back - Pain   Left Leg - Pain   Right Leg - Pain   HPI: Stephanie Pace is a 80 y.o. female who comes in today For evaluation management and possible epidural injection for chronic worsening severe low back pain and bilateral hip and leg pain.  She is followed in the office by both Dr. Aldean Baker and Dr. Lavada Mesi.  Most recently she was seen by [place] Persons, PA-C.  Patient reports that history today and her husband provides some of the history, 7 out of 10 pain traveling to both legs.  She reports worsening with laying down and sitting.  She does get some pain with standing and ambulating and this does affect her daily living quite a bit.  She has failed conservative care including physical therapy and medication management.  Those notes can be fully reviewed.  When you do look at the notes through Dr. Prince Rome and Dr. Lajoyce Corners she mainly was having left radicular leg pain.  She does report left more than right today when asked.  She is somewhat of a poor historian.  MRI was performed and reviewed below and does show significant disc herniation at L3-4 more foraminally on the left as well as multifactorial arthritic changes at L4-5 with lateral recess narrowing that could affect both limbs but no high-grade stenosis at that point.  More left-sided disc problem again at L5-S1.  Any of those could be painful conditions.  No history of prior lumbar surgery no red flag complaints.  Her case is complicated by chronic anticoagulation which is Eliquis.  We did obtain permission to have her off the Eliquis but somehow this was not communicated well with her.  Review of Systems  Musculoskeletal:  Positive for back pain and joint pain.  All other systems reviewed and are negative.  Otherwise per HPI.  Assessment & Plan: Visit Diagnoses:    ICD-10-CM   1. Lumbar radiculopathy  M54.16 betamethasone acetate-betamethasone sodium phosphate (CELESTONE) injection 12 mg    CANCELED: XR C-ARM NO REPORT    CANCELED: Epidural Steroid injection    2. Radiculopathy due to lumbar intervertebral disc disorder  M51.16     3. Spondylosis without myelopathy or radiculopathy, lumbar region  M47.816     4. Chronic bilateral low back pain with bilateral sciatica  M54.42    M54.41    G89.29        Plan: Findings:  Chronic worsening severe left much more than right hip and leg pain and low back pain worse with sitting which is more consistent with disc herniation and stenosis.  No high-grade central stenosis which is unlikely to occur given her current age.  She does have arthritic changes at the lower levels but no significant listhesis.  She has failed all other manner of conservative care and I do recommend epidural injection left-sided interlaminar injection off the blood thinner which we do have permission to take her off.  We will get that scheduled as quickly as we can.  Depending on relief could look at transforaminal approach.  The left foramen at L3 is very significantly narrowed do the disc herniation however.   Meds & Orders:  Meds ordered this encounter  Medications   betamethasone acetate-betamethasone sodium phosphate (  CELESTONE) injection 12 mg    No orders of the defined types were placed in this encounter.   Follow-up: Return for Left L4-5 interlaminar dural steroid injection..   Procedures: No procedures performed      Clinical History: MRI LUMBAR SPINE WITHOUT CONTRAST   TECHNIQUE: Multiplanar, multisequence MR imaging of the lumbar spine was performed. No intravenous contrast was administered.   COMPARISON:  Lumbar radiographs March 21, 2021.   FINDINGS: Segmentation: Standard segmentation is assumed. The inferior-most fully formed intervertebral disc  labeled L5-S1.   Alignment: No substantial sagittal subluxation. Mild lower lumbar dextrocurvature.   Vertebrae: Degenerative/discogenic endplate signal changes at L5-S1. No specific evidence of acute fracture discitis/osteomyelitis. Mildly heterogeneous marrow without focal suspicious bone lesion.   Conus medullaris and cauda equina: Conus extends to the superior L1 level. Conus appears normal.   Paraspinal and other soft tissues: Unremarkable.   Disc levels:   T11-T12: Only imaged sagittally without evidence of significant canal or foraminal stenosis.   T12-L1: No significant disc protrusion, foraminal stenosis, or canal stenosis.   L1-L2: Mild disc bulging without significant canal or foraminal stenosis.   L2-L3: Small right subarticular/foraminal disc protrusion without significant canal or foraminal stenosis.   L3-L4: Mild broad disc bulge with superimposed superiorly dissecting left foraminal disc protrusion (see series 3, image 10 and series 6, images 20 and 21). Resulting severe left foraminal stenosis with suspected impingement of the exiting left L3 nerve. No significant right foraminal stenosis. Mild canal and moderate left subarticular recess stenosis.   L4-L5: Disc desiccation and height loss. Broad disc bulge, mild facet hypertrophy and ligamentum flavum thickening. Resulting moderate bilateral subarticular recess stenosis and mild to moderate central canal stenosis. Mild bilateral foraminal stenosis. Left far lateral/extraforaminal disc contacts the exiting left L4 nerve without displacement (series 3, image 12).   L5-S1: Disc desiccation and height loss. Right eccentric disc bulge. There is far lateral right disc/osteophyte. Moderate right greater than left facet hypertrophy. Resulting moderate right foraminal stenosis. Mild left foraminal stenosis and bilateral subarticular recess stenosis.   IMPRESSION: 1. At L3-L4, superiorly dissecting left  foraminal disc protrusion with resulting severe left foraminal stenosis and suspected impingement of the exiting left L3 nerve. Mild central canal and moderate left subarticular recess stenosis. 2. At L5-S1, right eccentric degenerative disc disease with moderate right foraminal stenosis. Mild bilateral subarticular recess and left foraminal stenosis. 3. At L4-L5, moderate bilateral subarticular recess stenosis and mild to moderate central canal stenosis. Left far lateral/extraforaminal disc contacts the exiting left L4 nerve without displacement.     Electronically Signed   By: Feliberto Harts MD   On: 04/08/2021 07:00   She reports that she has been smoking cigarettes. She has never used smokeless tobacco. No results for input(s): HGBA1C, LABURIC in the last 8760 hours.  Objective:  VS:  HT:    WT:   BMI:     BP:135/85  HR:66bpm  TEMP: ( )  RESP:  Physical Exam Vitals and nursing note reviewed.  Constitutional:      General: She is not in acute distress.    Appearance: Normal appearance. She is obese. She is not ill-appearing.  HENT:     Head: Normocephalic and atraumatic.     Right Ear: External ear normal.     Left Ear: External ear normal.  Eyes:     Extraocular Movements: Extraocular movements intact.  Cardiovascular:     Rate and Rhythm: Normal rate.     Pulses: Normal pulses.  Pulmonary:     Effort: Pulmonary effort is normal. No respiratory distress.  Abdominal:     General: There is no distension.     Palpations: Abdomen is soft.  Musculoskeletal:        General: Tenderness present.     Cervical back: Neck supple.     Right lower leg: No edema.     Left lower leg: No edema.     Comments: Patient has good distal strength with no pain over the greater trochanters.  No clonus or focal weakness.  Skin:    Findings: No erythema, lesion or rash.  Neurological:     General: No focal deficit present.     Mental Status: She is alert and oriented to person,  place, and time.     Sensory: No sensory deficit.     Motor: No weakness or abnormal muscle tone.     Coordination: Coordination normal.  Psychiatric:        Mood and Affect: Mood normal.        Behavior: Behavior normal.    Ortho Exam  Imaging: No results found.  Past Medical/Family/Surgical/Social History: Medications & Allergies reviewed per EMR, new medications updated. Patient Active Problem List   Diagnosis Date Noted   Persistent atrial fibrillation (HCC) 01/30/2021   Secondary hypercoagulable state (HCC) 01/30/2021   Hypokalemia 01/30/2015   Type 2 diabetes mellitus without complication (HCC) 08/07/2014   COPD (chronic obstructive pulmonary disease) (HCC) 07/11/2014   Renal insufficiency 07/11/2014   Sciatica 07/11/2014   Essential hypertension 04/04/2014   Generalized anxiety disorder 04/03/2014   Hyperlipidemia LDL goal <100 04/03/2014   Osteoporosis 04/03/2014   Past Medical History:  Diagnosis Date   Diabetes mellitus without complication (HCC)    Hypertension    History reviewed. No pertinent family history. History reviewed. No pertinent surgical history. Social History   Occupational History   Not on file  Tobacco Use   Smoking status: Every Day    Pack years: 0.00    Types: Cigarettes   Smokeless tobacco: Never   Tobacco comments:    1 pack daily  Substance and Sexual Activity   Alcohol use: Yes    Alcohol/week: 1.0 standard drink    Types: 1 Cans of beer per week   Drug use: Never   Sexual activity: Not on file

## 2021-05-21 ENCOUNTER — Ambulatory Visit: Payer: Medicare Other | Admitting: Orthopedic Surgery

## 2021-05-21 ENCOUNTER — Ambulatory Visit (INDEPENDENT_AMBULATORY_CARE_PROVIDER_SITE_OTHER): Payer: Medicare Other | Admitting: Physical Medicine and Rehabilitation

## 2021-05-21 ENCOUNTER — Other Ambulatory Visit: Payer: Self-pay

## 2021-05-21 ENCOUNTER — Encounter: Payer: Self-pay | Admitting: Physical Medicine and Rehabilitation

## 2021-05-21 ENCOUNTER — Ambulatory Visit: Payer: Self-pay

## 2021-05-21 VITALS — BP 174/90 | HR 114

## 2021-05-21 DIAGNOSIS — M5116 Intervertebral disc disorders with radiculopathy, lumbar region: Secondary | ICD-10-CM | POA: Diagnosis not present

## 2021-05-21 MED ORDER — METHYLPREDNISOLONE ACETATE 80 MG/ML IJ SUSP
80.0000 mg | Freq: Once | INTRAMUSCULAR | Status: AC
  Administered 2021-05-21: 80 mg

## 2021-05-21 NOTE — Progress Notes (Signed)
Pt state lower back pain that travels down to her left leg. Pt state sitting, bending and walking makes the pain worse. Pt state she takes pain meds to help ease her pain.  Numeric Pain Rating Scale and Functional Assessment Average Pain 9   In the last MONTH (on 0-10 scale) has pain interfered with the following?  1. General activity like being  able to carry out your everyday physical activities such as walking, climbing stairs, carrying groceries, or moving a chair?  Rating(9)   +Driver, +BT pt has been off for three days, -Dye Allergies.

## 2021-05-21 NOTE — Patient Instructions (Signed)

## 2021-05-23 NOTE — Procedures (Signed)
Lumbar Epidural Steroid Injection - Interlaminar Approach with Fluoroscopic Guidance  Patient: Stephanie Pace      Date of Birth: 28-Sep-1941 MRN: [id number] PCP: Shellia Cleverly, PA      Visit Date: 05/21/2021   Universal Protocol:     Consent Given By: the patient  Position: PRONE  Additional Comments: Vital signs were monitored before and after the procedure. Patient was prepped and draped in the usual sterile fashion. The correct patient, procedure, and site was verified.   Injection Procedure Details:   Procedure diagnoses: Radiculopathy due to lumbar intervertebral disc disorder [M51.16]   Meds Administered:  Meds ordered this encounter  Medications   methylPREDNISolone acetate (DEPO-MEDROL) injection 80 mg     Laterality: Left  Location/Site:  L3-L4  Needle: 3.5 in., 20 ga. Tuohy  Needle Placement: Paramedian epidural  Findings:   -Comments: Excellent flow of contrast into the epidural space.  Procedure Details: Using a paramedian approach from the side mentioned above, the region overlying the inferior lamina was localized under fluoroscopic visualization and the soft tissues overlying this structure were infiltrated with 4 ml. of 1% Lidocaine without Epinephrine. The Tuohy needle was inserted into the epidural space using a paramedian approach.   The epidural space was localized using loss of resistance along with counter oblique bi-planar fluoroscopic views.  After negative aspirate for air, blood, and CSF, a 2 ml. volume of Isovue-250 was injected into the epidural space and the flow of contrast was observed. Radiographs were obtained for documentation purposes.    The injectate was administered into the level noted above.   Additional Comments:  The patient tolerated the procedure well Dressing: 2 x 2 sterile gauze and Band-Aid    Post-procedure details: Patient was observed during the procedure. Post-procedure instructions were reviewed.  Patient left  the clinic in stable condition.

## 2021-05-23 NOTE — Progress Notes (Signed)
Stephanie Pace - 80 y.o. female MRN [id number]  Date of birth: [birth year]  Office Visit Note: Visit Date: 05/21/2021 PCP: Shellia Cleverly, PA Referred by: Shellia Cleverly, PA  Subjective: Chief Complaint  Patient presents with   Lower Back - Pain   Left Leg - Pain   HPI:  Forest Pruden is a 80 y.o. female who comes in today for planned Left L3-L4 Lumbar Interlaminar epidural steroid injection with fluoroscopic guidance.  The patient has failed conservative care including home exercise, medications, time and activity modification.  This injection will be diagnostic and hopefully therapeutic.  Please see requesting physician notes for further details and justification.   ROS Otherwise per HPI.  Assessment & Plan: Visit Diagnoses:    ICD-10-CM   1. Radiculopathy due to lumbar intervertebral disc disorder  M51.16 XR C-ARM NO REPORT    Epidural Steroid injection    methylPREDNISolone acetate (DEPO-MEDROL) injection 80 mg      Plan: No additional findings.   Meds & Orders:  Meds ordered this encounter  Medications   methylPREDNISolone acetate (DEPO-MEDROL) injection 80 mg    Orders Placed This Encounter  Procedures   XR C-ARM NO REPORT   Epidural Steroid injection    Follow-up: Return if symptoms worsen or fail to improve.   Procedures: No procedures performed  Lumbar Epidural Steroid Injection - Interlaminar Approach with Fluoroscopic Guidance  Patient: Stephanie Pace      Date of Birth: Dec 10, 1940 MRN: [id number] PCP: Shellia Cleverly, PA      Visit Date: 05/21/2021   Universal Protocol:     Consent Given By: the patient  Position: PRONE  Additional Comments: Vital signs were monitored before and after the procedure. Patient was prepped and draped in the usual sterile fashion. The correct patient, procedure, and site was verified.   Injection Procedure Details:   Procedure diagnoses: Radiculopathy due to lumbar intervertebral disc disorder [M51.16]   Meds Administered:   Meds ordered this encounter  Medications   methylPREDNISolone acetate (DEPO-MEDROL) injection 80 mg     Laterality: Left  Location/Site:  L3-L4  Needle: 3.5 in., 20 ga. Tuohy  Needle Placement: Paramedian epidural  Findings:   -Comments: Excellent flow of contrast into the epidural space.  Procedure Details: Using a paramedian approach from the side mentioned above, the region overlying the inferior lamina was localized under fluoroscopic visualization and the soft tissues overlying this structure were infiltrated with 4 ml. of 1% Lidocaine without Epinephrine. The Tuohy needle was inserted into the epidural space using a paramedian approach.   The epidural space was localized using loss of resistance along with counter oblique bi-planar fluoroscopic views.  After negative aspirate for air, blood, and CSF, a 2 ml. volume of Isovue-250 was injected into the epidural space and the flow of contrast was observed. Radiographs were obtained for documentation purposes.    The injectate was administered into the level noted above.   Additional Comments:  The patient tolerated the procedure well Dressing: 2 x 2 sterile gauze and Band-Aid    Post-procedure details: Patient was observed during the procedure. Post-procedure instructions were reviewed.  Patient left the clinic in stable condition.   Clinical History: MRI LUMBAR SPINE WITHOUT CONTRAST   TECHNIQUE: Multiplanar, multisequence MR imaging of the lumbar spine was performed. No intravenous contrast was administered.   COMPARISON:  Lumbar radiographs March 21, 2021.   FINDINGS: Segmentation: Standard segmentation is assumed. The inferior-most fully formed intervertebral disc labeled L5-S1.   Alignment: No  substantial sagittal subluxation. Mild lower lumbar dextrocurvature.   Vertebrae: Degenerative/discogenic endplate signal changes at L5-S1. No specific evidence of acute fracture discitis/osteomyelitis. Mildly  heterogeneous marrow without focal suspicious bone lesion.   Conus medullaris and cauda equina: Conus extends to the superior L1 level. Conus appears normal.   Paraspinal and other soft tissues: Unremarkable.   Disc levels:   T11-T12: Only imaged sagittally without evidence of significant canal or foraminal stenosis.   T12-L1: No significant disc protrusion, foraminal stenosis, or canal stenosis.   L1-L2: Mild disc bulging without significant canal or foraminal stenosis.   L2-L3: Small right subarticular/foraminal disc protrusion without significant canal or foraminal stenosis.   L3-L4: Mild broad disc bulge with superimposed superiorly dissecting left foraminal disc protrusion (see series 3, image 10 and series 6, images 20 and 21). Resulting severe left foraminal stenosis with suspected impingement of the exiting left L3 nerve. No significant right foraminal stenosis. Mild canal and moderate left subarticular recess stenosis.   L4-L5: Disc desiccation and height loss. Broad disc bulge, mild facet hypertrophy and ligamentum flavum thickening. Resulting moderate bilateral subarticular recess stenosis and mild to moderate central canal stenosis. Mild bilateral foraminal stenosis. Left far lateral/extraforaminal disc contacts the exiting left L4 nerve without displacement (series 3, image 12).   L5-S1: Disc desiccation and height loss. Right eccentric disc bulge. There is far lateral right disc/osteophyte. Moderate right greater than left facet hypertrophy. Resulting moderate right foraminal stenosis. Mild left foraminal stenosis and bilateral subarticular recess stenosis.   IMPRESSION: 1. At L3-L4, superiorly dissecting left foraminal disc protrusion with resulting severe left foraminal stenosis and suspected impingement of the exiting left L3 nerve. Mild central canal and moderate left subarticular recess stenosis. 2. At L5-S1, right eccentric degenerative disc disease  with moderate right foraminal stenosis. Mild bilateral subarticular recess and left foraminal stenosis. 3. At L4-L5, moderate bilateral subarticular recess stenosis and mild to moderate central canal stenosis. Left far lateral/extraforaminal disc contacts the exiting left L4 nerve without displacement.     Electronically Signed   By: Feliberto Harts MD   On: 04/08/2021 07:00     Objective:  VS:  HT:    WT:   BMI:     BP:(!) 174/90  HR:(!) 114bpm  TEMP: ( )  RESP:  Physical Exam Vitals and nursing note reviewed.  Constitutional:      General: She is not in acute distress.    Appearance: Normal appearance. She is not ill-appearing.  HENT:     Head: Normocephalic and atraumatic.     Right Ear: External ear normal.     Left Ear: External ear normal.  Eyes:     Extraocular Movements: Extraocular movements intact.  Cardiovascular:     Rate and Rhythm: Normal rate.     Pulses: Normal pulses.  Pulmonary:     Effort: Pulmonary effort is normal. No respiratory distress.  Abdominal:     General: There is no distension.     Palpations: Abdomen is soft.  Musculoskeletal:        General: Tenderness present.     Cervical back: Neck supple.     Right lower leg: No edema.     Left lower leg: No edema.     Comments: Patient has good distal strength with no pain over the greater trochanters.  No clonus or focal weakness.  Skin:    Findings: No erythema, lesion or rash.  Neurological:     General: No focal deficit present.  Mental Status: She is alert and oriented to person, place, and time.     Sensory: No sensory deficit.     Motor: No weakness or abnormal muscle tone.     Coordination: Coordination normal.  Psychiatric:        Mood and Affect: Mood normal.        Behavior: Behavior normal.     Imaging: No results found.

## 2021-05-24 ENCOUNTER — Telehealth: Payer: Self-pay

## 2021-05-24 NOTE — Telephone Encounter (Signed)
Patient called she stated she received a injection and she currently having pain in her lower back she wants to know if this is normal call back:[phone]

## 2021-06-18 ENCOUNTER — Ambulatory Visit (HOSPITAL_COMMUNITY): Payer: Medicare Other | Admitting: Physician Assistant

## 2021-08-08 IMAGING — MR MR LUMBAR SPINE W/O CM
5 of 6 series · 30 of 48 positions shown · non-contrast
Comparison: Lumbar radiographs March 21, 2021.

CLINICAL DATA: Lumbar radiculopathy, radiating into left leg.

EXAM:
MRI LUMBAR SPINE WITHOUT CONTRAST
TECHNIQUE: Multiplanar, multisequence MR imaging of the lumbar spine was
performed. No intravenous contrast was administered.

[Series 3: T2 · sagittal · 4.0mm · 0.51mm/px · 5 of 15 slices shown (1 of 2)]
[im 1/15]
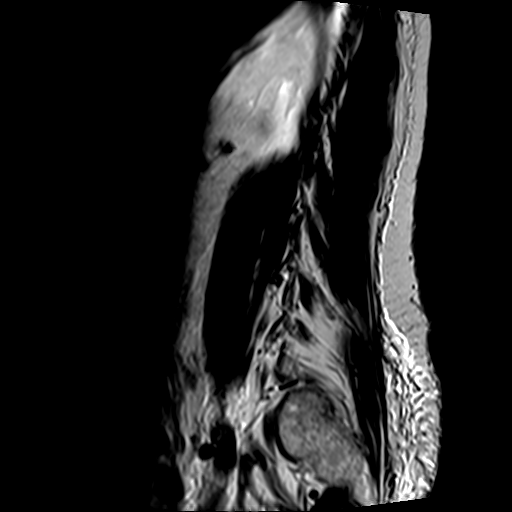
[im 4/15]
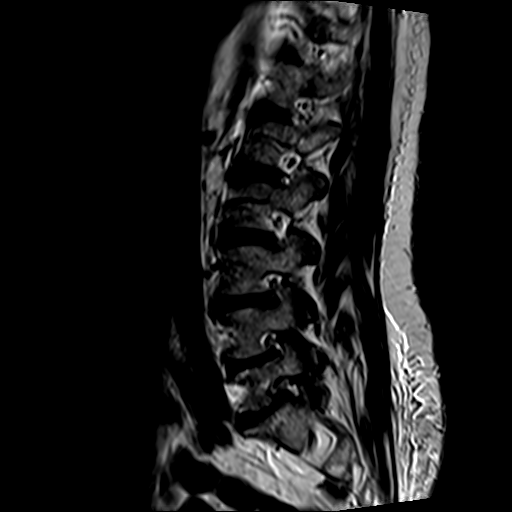
[im 8/15]
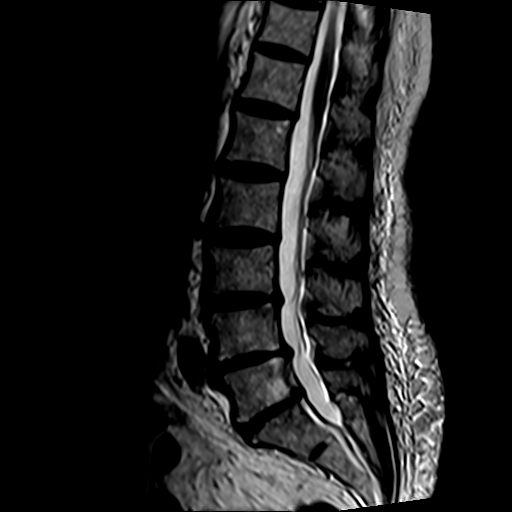
[im 11/15]
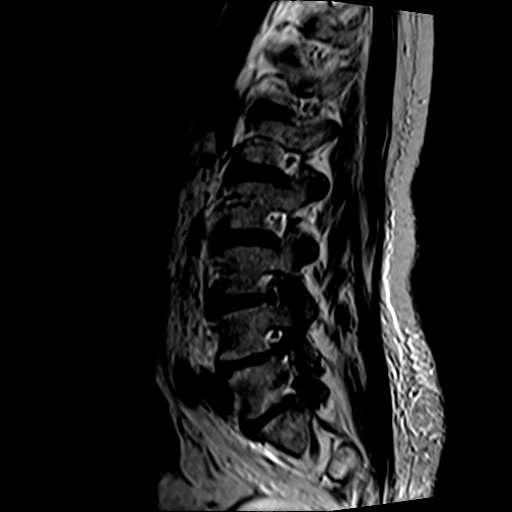
[im 15/15]
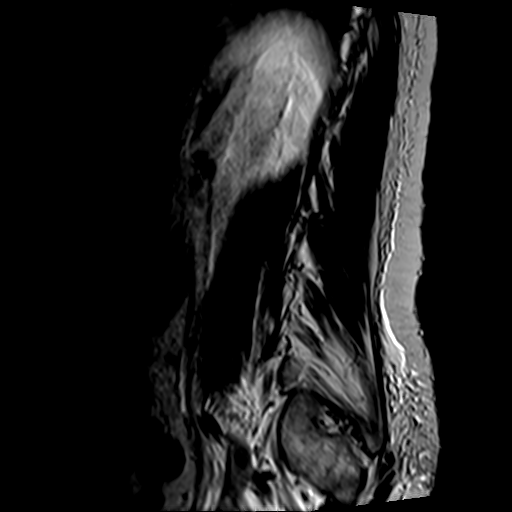

[Series 4: STIR · sagittal · 4.0mm · 0.51mm/px · 2 of 15 slices shown]
[im 1/15]
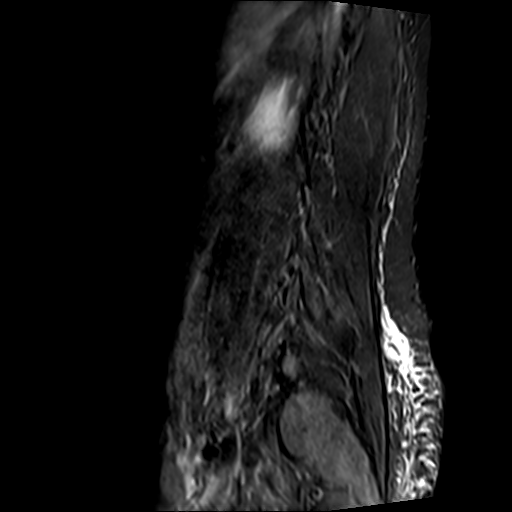
[im 4/15]
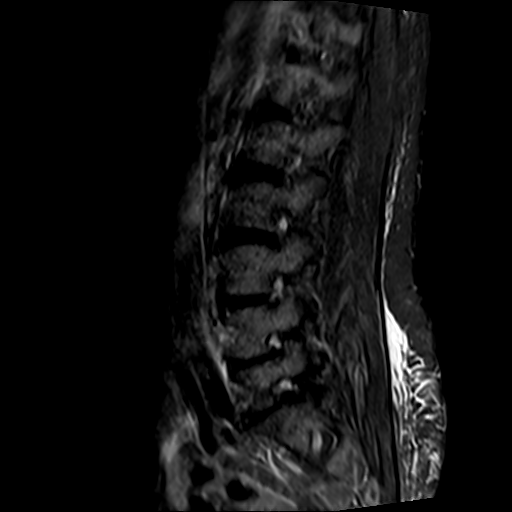

[Series 5: T1 · sagittal · 4.0mm · 0.51mm/px · 5 of 15 slices shown (1 of 2)]
[im 1/15]
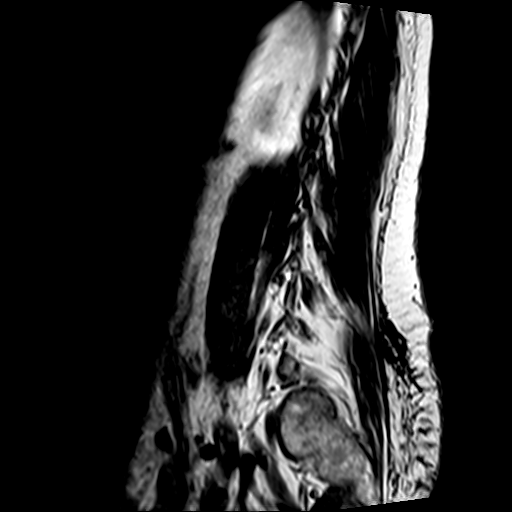
[im 4/15]
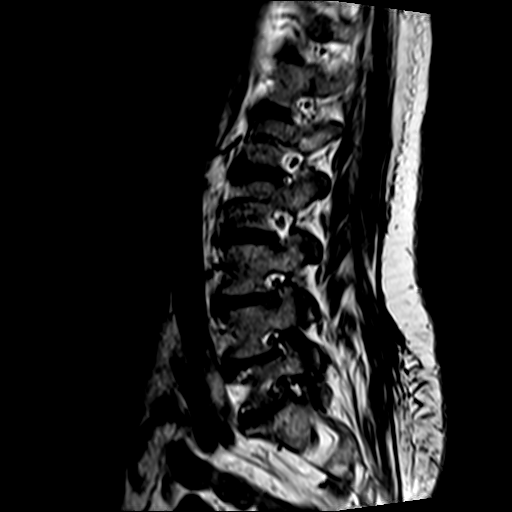
[im 8/15]
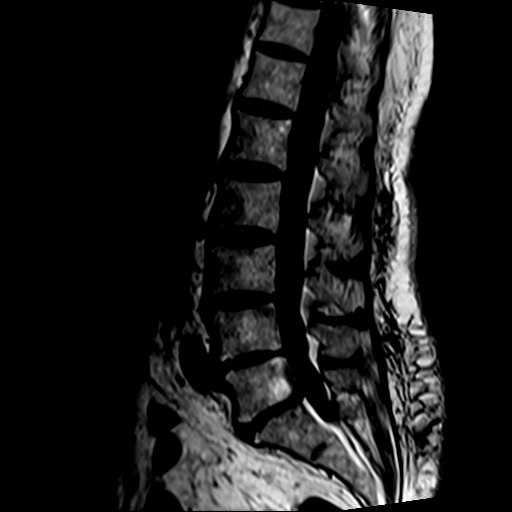
[im 11/15]
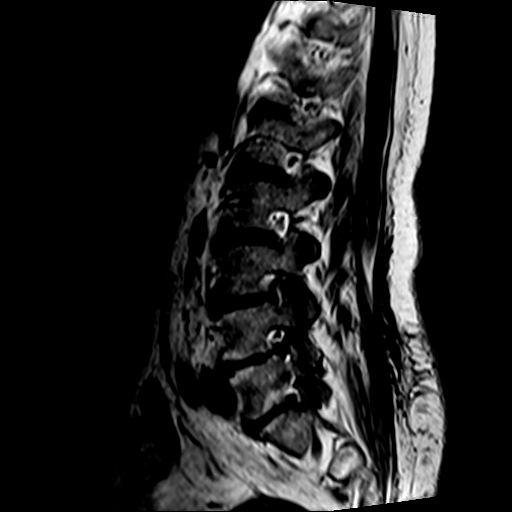
[im 15/15]
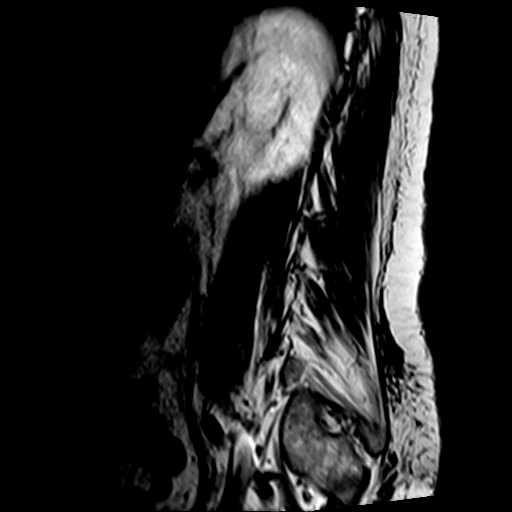

[Series 6: T2 · axial · 4.0mm · 0.78mm/px · z∈[-19,+194]mm · 9 of 38 slices shown (2 of 2)]
[im 1/38]
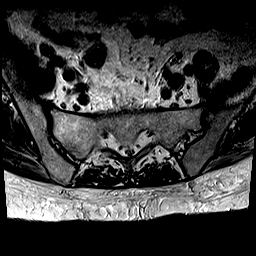
[im 7/38]
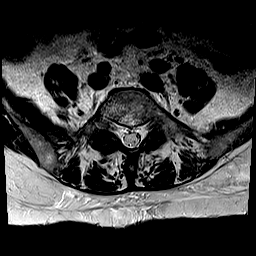
[im 13/38]
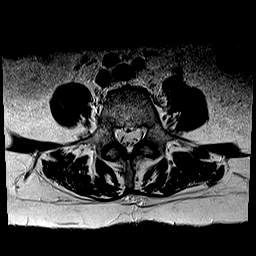
[im 16/38]
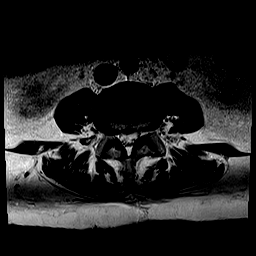
[im 19/38]
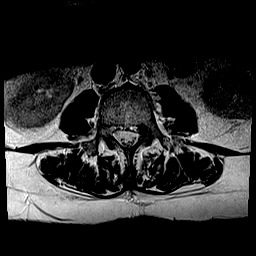
[im 22/38]
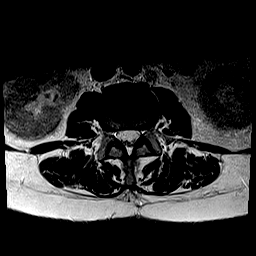
[im 25/38]
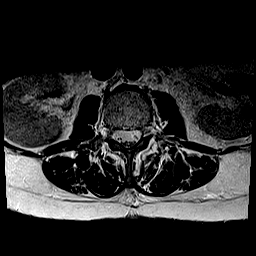
[im 31/38]
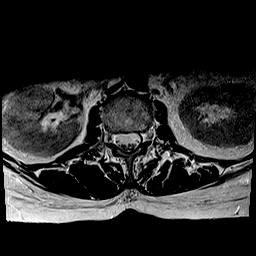
[im 38/38]
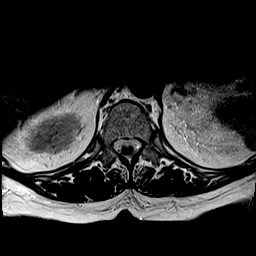

[Series 7: T1 · axial · 4.0mm · 0.78mm/px · z∈[-19,+194]mm · 9 of 38 slices shown (2 of 2)]
[im 1/38]
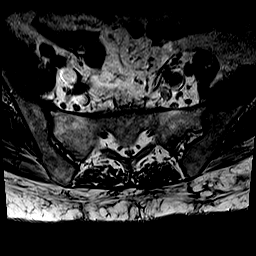
[im 7/38]
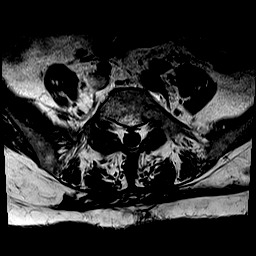
[im 13/38]
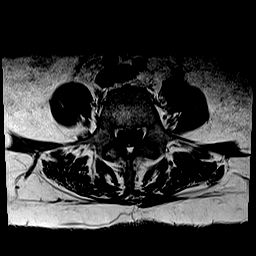
[im 16/38]
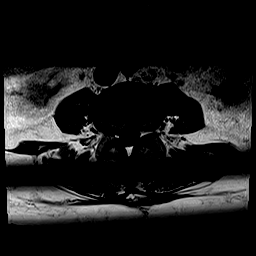
[im 19/38]
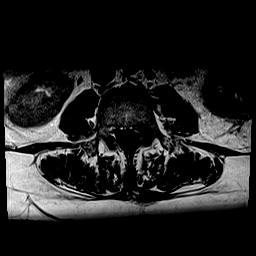
[im 22/38]
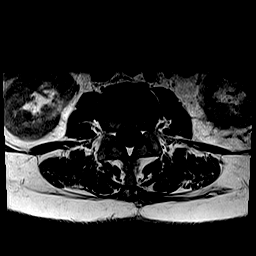
[im 25/38]
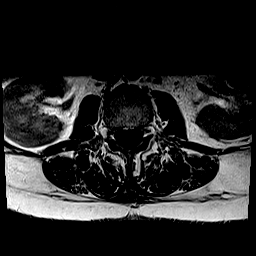
[im 31/38]
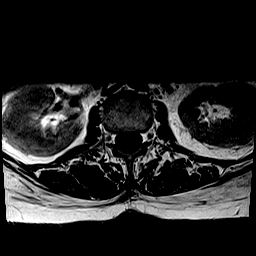
[im 38/38]
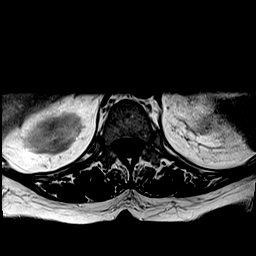

[30 of 48 positions shown; findings below may reference images not displayed]

FINDINGS: Segmentation: Standard segmentation is assumed. The inferior-most
fully formed intervertebral disc labeled L5-S1.

Alignment: No substantial sagittal subluxation. Mild lower lumbar
dextrocurvature.

Vertebrae: Degenerative/discogenic endplate signal changes at L5-S1.
No specific evidence of acute fracture discitis/osteomyelitis.
Mildly heterogeneous marrow without focal suspicious bone lesion.

Conus medullaris and cauda equina: Conus extends to the superior L1
level. Conus appears normal.

Paraspinal and other soft tissues: Unremarkable.

Disc levels:

T11-T12: Only imaged sagittally without evidence of significant
canal or foraminal stenosis.

T12-L1: No significant disc protrusion, foraminal stenosis, or canal
stenosis.

L1-L2: Mild disc bulging without significant canal or foraminal
stenosis.

L2-L3: Small right subarticular/foraminal disc protrusion without
significant canal or foraminal stenosis.

L3-L4: Mild broad disc bulge with superimposed superiorly dissecting
left foraminal disc protrusion (see series 3, image 10 and series 6,
images 20 and 21). Resulting severe left foraminal stenosis with
suspected impingement of the exiting left L3 nerve. No significant
right foraminal stenosis. Mild canal and moderate left subarticular
recess stenosis.

L4-L5: Disc desiccation and height loss. Broad disc bulge, mild
facet hypertrophy and ligamentum flavum thickening. Resulting
moderate bilateral subarticular recess stenosis and mild to moderate
central canal stenosis. Mild bilateral foraminal stenosis. Left far
lateral/extraforaminal disc contacts the exiting left L4 nerve
without displacement (series 3, image 12).

L5-S1: Disc desiccation and height loss. Right eccentric disc bulge.
There is far lateral right disc/osteophyte. Moderate right greater
than left facet hypertrophy. Resulting moderate right foraminal
stenosis. Mild left foraminal stenosis and bilateral subarticular
recess stenosis.
IMPRESSION: 1. At L3-L4, superiorly dissecting left foraminal disc protrusion
with resulting severe left foraminal stenosis and suspected
impingement of the exiting left L3 nerve. Mild central canal and
moderate left subarticular recess stenosis.
2. At L5-S1, right eccentric degenerative disc disease with moderate
right foraminal stenosis. Mild bilateral subarticular recess and
left foraminal stenosis.
3. At L4-L5, moderate bilateral subarticular recess stenosis and
mild to moderate central canal stenosis. Left far
lateral/extraforaminal disc contacts the exiting left L4 nerve
without displacement.

## 2023-11-19 ENCOUNTER — Ambulatory Visit: Payer: Medicare Other | Admitting: Orthopedic Surgery

## 2023-11-19 DIAGNOSIS — I87331 Chronic venous hypertension (idiopathic) with ulcer and inflammation of right lower extremity: Secondary | ICD-10-CM | POA: Diagnosis not present

## 2023-11-19 MED ORDER — DOXYCYCLINE HYCLATE 100 MG PO TABS: 100.0000 mg | ORAL_TABLET | Freq: Two times a day (BID) | ORAL | 0 refills | Status: DC

## 2023-11-20 ENCOUNTER — Encounter: Payer: Self-pay | Admitting: Orthopedic Surgery

## 2023-11-20 ENCOUNTER — Telehealth: Payer: Self-pay | Admitting: Orthopedic Surgery

## 2023-11-20 NOTE — Progress Notes (Signed)
Office Visit Note   Patient: Stephanie Pace           Date of Birth: 02-Jun-1941           MRN: [id number] Visit Date: 11/19/2023              Requested by: Shellia Cleverly, PA [address] [place],  [place] [address] PCP: Shellia Cleverly, [place]  Chief Complaint  Patient presents with   Right Leg - Pain   Left Foot - Pain      HPI: Patient is an 82 year old woman who is seen for initial evaluation for venous insufficiency ulceration right leg.  Patient states her leg pain is worse when it is cold.  Patient states she has had increased swelling and fluid draining from the leg.  Assessment & Plan: Visit Diagnoses:  1. 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  Prescription provided for doxycycline.  Follow-up on Monday.  Follow-Up Instructions: Return in about 1 week (around 11/26/2023).   Ortho Exam  Patient is alert, oriented, no adenopathy, well-dressed, normal affect, normal respiratory effort. Examination patient is  Imaging: No results found. No images are attached to the encounter.  Labs: No results found for: "HGBA1C", "ESRSEDRATE", "CRP", "LABURIC", "REPTSTATUS", "GRAMSTAIN", "CULT", "LABORGA" Is good palpable dorsalis pedis pulses bilaterally.  No sign of arterial insufficiency.  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  There is cellulitis in the right leg.  History negative for diabetes.  Calf measures 37 cm in circumference.  Lab Results  Component Value Date   ALBUMIN 3.5 01/21/2021    Lab Results  Component Value Date   MG 1.5 (L) 01/21/2021   MG 1.8 02/03/2019   No results found for: "VD25OH"  No results found for: "PREALBUMIN"    Latest Ref Rng & Units 03/19/2021    3:31 PM 01/21/2021   11:38 AM 02/03/2019   10:24 AM  CBC EXTENDED  WBC 4.0 - 10.5 K/uL 5.2  6.6  5.7   RBC 3.87 -  5.11 MIL/uL 2.91  4.03  4.18   Hemoglobin 12.0 - 15.0 g/dL 8.6  56.2  13.0   HCT 86.5 - 46.0 % 27.1  35.0  38.3   Platelets 150 - 400 K/uL 290  367  291   NEUT# 1.7 - 7.7 K/uL  4.8  4.0   Lymph# 0.7 - 4.0 K/uL  1.1  1.0      There is no height or weight on file to calculate BMI.  Orders:  No orders of the defined types were placed in this encounter.  Meds ordered this encounter  Medications   doxycycline (VIBRA-TABS) 100 MG tablet    Sig: Take 1 tablet (100 mg total) by mouth 2 (two) times daily.    Dispense:  60 tablet    Refill:  0     Procedures: No procedures performed  Clinical Data: No additional findings.  ROS:  All other systems negative, except as noted in the HPI. Review of Systems  Objective: Vital Signs: There were no vitals taken for this visit.  Specialty Comments:  No specialty comments available.  PMFS History: Patient Active Problem List   Diagnosis Date Noted   Persistent atrial fibrillation (HCC) 01/30/2021   Secondary hypercoagulable state (HCC) 01/30/2021   Hypokalemia 01/30/2015   Type 2 diabetes  mellitus without complication (HCC) 08/07/2014   COPD (chronic obstructive pulmonary disease) (HCC) 07/11/2014   Renal insufficiency 07/11/2014   Sciatica 07/11/2014   Essential hypertension 04/04/2014   Generalized anxiety disorder 04/03/2014   Hyperlipidemia LDL goal <100 04/03/2014   Osteoporosis 04/03/2014   Past Medical History:  Diagnosis Date   Diabetes mellitus without complication (HCC)    Hypertension     History reviewed. No pertinent family history.  History reviewed. No pertinent surgical history. Social History   Occupational History   Not on file  Tobacco Use   Smoking status: Every Day    Types: Cigarettes   Smokeless tobacco: Never   Tobacco comments:    1 pack daily  Substance and Sexual Activity   Alcohol use: Yes    Alcohol/week: 1.0 standard drink of alcohol    Types: 1 Cans of beer per week   Drug use:  Never   Sexual activity: Not on file

## 2023-11-20 NOTE — Telephone Encounter (Signed)
I called pt to advise and she voiced understanding will call with any other questions.

## 2023-11-20 NOTE — Telephone Encounter (Signed)
Patient called advised the case around her leg is damp due to her sweating last night. Patient said she is hurting real bad right now and asked if Autumn would give her a call. Patient said her leg hurts when she get up to walk. Patient said she is keeping her leg up like she is suppose to. The number to contact patient is [phone].

## 2023-11-20 NOTE — Telephone Encounter (Signed)
Pt was in the office yesterday and compression wrap applied. States that she is in a great deal of discomfort and asked if you would call something in for pain. She is elevating her leg. She is not allergic to any medication and is not taking anything currently other than OTC. Uses the [facility] in [place]. Please advise.

## 2023-11-23 ENCOUNTER — Ambulatory Visit: Payer: Medicare Other | Admitting: Orthopedic Surgery

## 2023-11-23 DIAGNOSIS — I87331 Chronic venous hypertension (idiopathic) with ulcer and inflammation of right lower extremity: Secondary | ICD-10-CM

## 2023-11-24 ENCOUNTER — Telehealth: Payer: Self-pay | Admitting: Orthopedic Surgery

## 2023-11-24 ENCOUNTER — Other Ambulatory Visit: Payer: Self-pay | Admitting: Orthopedic Surgery

## 2023-11-24 MED ORDER — HYDROCODONE-ACETAMINOPHEN 5-325 MG PO TABS: 1.0000 | ORAL_TABLET | Freq: Four times a day (QID) | ORAL | 0 refills | Status: DC | PRN

## 2023-11-24 NOTE — Telephone Encounter (Signed)
Patient called and ask if you can prescribe something for pain. CB#[phone]

## 2023-11-24 NOTE — Telephone Encounter (Signed)
I called pt to advisre that rx has been sent to pharmacy

## 2023-11-24 NOTE — Telephone Encounter (Signed)
Pt was in the office yesterday. Profroe dressing applied she is asking if you will call in rx for pain.

## 2023-11-29 ENCOUNTER — Encounter: Payer: Self-pay | Admitting: Orthopedic Surgery

## 2023-11-29 NOTE — Progress Notes (Signed)
Office Visit Note   Patient: Stephanie Pace           Date of Birth: Jun 19, 1941           MRN: [id number] Visit Date: 11/23/2023              Requested by: Shellia Cleverly, PA [address] [place],  [place] [address] PCP: Shellia Cleverly, [place]  Chief Complaint  Patient presents with   Right Leg - Follow-up      HPI: Patient is an 82 year old woman who presents with venous ulceration right leg.  Patient is currently on doxycycline.  Assessment & Plan: Visit Diagnoses:  1. 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  Will follow-up Monday to change the compression wrap.  Follow-Up Instructions: Return in about 1 week (around 11/30/2023).   Ortho Exam  Patient is alert, oriented, no adenopathy, well-dressed, normal affect, normal respiratory effort. Examination the skin is wrinkling the ulcer has decreased drainage.  Patient states she cannot use compression socks.  Imaging: No results found. No images are attached to the encounter.  Labs: No results found for: "HGBA1C", "ESRSEDRATE", "CRP", "LABURIC", "REPTSTATUS", "GRAMSTAIN", "CULT", "LABORGA"   Lab Results  Component Value Date   ALBUMIN 3.5 01/21/2021    Lab Results  Component Value Date   MG 1.5 (L) 01/21/2021   MG 1.8 02/03/2019   No results found for: "VD25OH"  No results found for: "PREALBUMIN"    Latest Ref Rng & Units 03/19/2021    3:31 PM 01/21/2021   11:38 AM 02/03/2019   10:24 AM  CBC EXTENDED  WBC 4.0 - 10.5 K/uL 5.2  6.6  5.7   RBC 3.87 - 5.11 MIL/uL 2.91  4.03  4.18   Hemoglobin 12.0 - 15.0 g/dL 8.6  19.1  47.8   HCT 29.5 - 46.0 % 27.1  35.0  38.3   Platelets 150 - 400 K/uL 290  367  291   NEUT# 1.7 - 7.7 K/uL  4.8  4.0   Lymph# 0.7 - 4.0 K/uL  1.1  1.0      There is no height or weight on file to calculate BMI.  Orders:  No orders of the defined types were placed in this encounter.  No orders of  the defined types were placed in this encounter.    Procedures: No procedures performed  Clinical Data: No additional findings.  ROS:  All other systems negative, except as noted in the HPI. Review of Systems  Objective: Vital Signs: There were no vitals taken for this visit.  Specialty Comments:  No specialty comments available.  PMFS History: Patient Active Problem List   Diagnosis Date Noted   Persistent atrial fibrillation (HCC) 01/30/2021   Secondary hypercoagulable state (HCC) 01/30/2021   Hypokalemia 01/30/2015   Type 2 diabetes mellitus without complication (HCC) 08/07/2014   COPD (chronic obstructive pulmonary disease) (HCC) 07/11/2014   Renal insufficiency 07/11/2014   Sciatica 07/11/2014   Essential hypertension 04/04/2014   Generalized anxiety disorder 04/03/2014   Hyperlipidemia LDL goal <100 04/03/2014   Osteoporosis 04/03/2014   Past Medical History:  Diagnosis Date   Diabetes mellitus without complication (HCC)    Hypertension     History reviewed. No pertinent family history.  History reviewed. No pertinent surgical history. Social History   Occupational History   Not on file  Tobacco Use   Smoking status: Every Day  Types: Cigarettes   Smokeless tobacco: Never   Tobacco comments:    1 pack daily  Substance and Sexual Activity   Alcohol use: Yes    Alcohol/week: 1.0 standard drink of alcohol    Types: 1 Cans of beer per week   Drug use: Never   Sexual activity: Not on file

## 2023-11-30 ENCOUNTER — Ambulatory Visit: Payer: Medicare Other

## 2023-11-30 DIAGNOSIS — I87331 Chronic venous hypertension (idiopathic) with ulcer and inflammation of right lower extremity: Secondary | ICD-10-CM

## 2023-11-30 NOTE — Progress Notes (Signed)
Patient came in today for nurse visit for a dressing change. I took off her profore wrap from last week. Washed wound with vashe wound solution and rewrap RLE with profore wrap. She will see Dr. Lajoyce Corners in one week for follow up.

## 2023-12-07 ENCOUNTER — Ambulatory Visit: Payer: Medicare Other | Admitting: Orthopedic Surgery

## 2023-12-07 DIAGNOSIS — I87331 Chronic venous hypertension (idiopathic) with ulcer and inflammation of right lower extremity: Secondary | ICD-10-CM

## 2023-12-08 ENCOUNTER — Encounter: Payer: Self-pay | Admitting: Orthopedic Surgery

## 2023-12-14 ENCOUNTER — Ambulatory Visit: Payer: Medicare Other | Admitting: Orthopedic Surgery

## 2023-12-14 ENCOUNTER — Telehealth: Payer: Self-pay | Admitting: Orthopedic Surgery

## 2023-12-14 NOTE — Telephone Encounter (Signed)
Pt coming in tomorrow at 330

## 2023-12-14 NOTE — Telephone Encounter (Signed)
 Patient called and wants to rescedule her appointment but she needs to come in this week for wrap the wound. CB#[phone]

## 2023-12-15 ENCOUNTER — Ambulatory Visit: Payer: Medicare Other | Admitting: Orthopedic Surgery

## 2023-12-15 DIAGNOSIS — I87331 Chronic venous hypertension (idiopathic) with ulcer and inflammation of right lower extremity: Secondary | ICD-10-CM

## 2023-12-21 ENCOUNTER — Encounter: Payer: Self-pay | Admitting: Orthopedic Surgery

## 2023-12-21 NOTE — Progress Notes (Signed)
 Office Visit Note   Patient: Stephanie Pace           Date of Birth: 1941-05-27           MRN: [id number] Visit Date: 12/15/2023              Requested by: Duwayne Katheryn HERO, PA [address] [place],  [place] [address] PCP: Duwayne Katheryn HERO, [place]  Chief Complaint  Patient presents with   Right Leg - Follow-up      HPI: Patient is an 83 year old woman who is seen in follow-up for venous insufficiency ulcer right lower extremity.  Patient states she remove the Coban wrap secondary to it being too tight.  Assessment & Plan: Visit Diagnoses:  1. 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  She will bring her socks with her at follow-up.  Follow-Up Instructions: Return in about 1 week (around 12/22/2023).   Ortho Exam  Patient is alert, oriented, no adenopathy, well-dressed, normal affect, normal respiratory effort. Examination the skin is wrinkling well she has a palpable pulse the venous ulcer is almost completely healed.  Imaging: No results found. No images are attached to the encounter.  Labs: No results found for: HGBA1C, ESRSEDRATE, CRP, LABURIC, REPTSTATUS, GRAMSTAIN, CULT, LABORGA   Lab Results  Component Value Date   ALBUMIN 3.5 01/21/2021    Lab Results  Component Value Date   MG 1.5 (L) 01/21/2021   MG 1.8 02/03/2019   No results found for: VD25OH  No results found for: PREALBUMIN    Latest Ref Rng & Units 03/19/2021    3:31 PM 01/21/2021   11:38 AM 02/03/2019   10:24 AM  CBC EXTENDED  WBC 4.0 - 10.5 K/uL 5.2  6.6  5.7   RBC 3.87 - 5.11 MIL/uL 2.91  4.03  4.18   Hemoglobin 12.0 - 15.0 g/dL 8.6  89.0  87.9   HCT 63.9 - 46.0 % 27.1  35.0  38.3   Platelets 150 - 400 K/uL 290  367  291   NEUT# 1.7 - 7.7 K/uL  4.8  4.0   Lymph# 0.7 - 4.0 K/uL  1.1  1.0      There is no height or  weight on file to calculate BMI.  Orders:  No orders of the defined types were placed in this encounter.  No orders of the defined types were placed in this encounter.    Procedures: No procedures performed  Clinical Data: No additional findings.  ROS:  All other systems negative, except as noted in the HPI. Review of Systems  Objective: Vital Signs: There were no vitals taken for this visit.  Specialty Comments:  No specialty comments available.  PMFS History: Patient Active Problem List   Diagnosis Date Noted   Persistent atrial fibrillation (HCC) 01/30/2021   Secondary hypercoagulable state (HCC) 01/30/2021   Hypokalemia 01/30/2015   Type 2 diabetes mellitus without complication (HCC) 08/07/2014   COPD (chronic obstructive pulmonary disease) (HCC) 07/11/2014   Renal insufficiency 07/11/2014   Sciatica 07/11/2014   Essential hypertension 04/04/2014   Generalized anxiety disorder 04/03/2014   Hyperlipidemia LDL goal <100 04/03/2014   Osteoporosis 04/03/2014   Past Medical History:  Diagnosis Date   Diabetes mellitus without complication (HCC)    Hypertension     History reviewed. No pertinent family history.  History reviewed. No  pertinent surgical history. Social History   Occupational History   Not on file  Tobacco Use   Smoking status: Every Day    Types: Cigarettes   Smokeless tobacco: Never   Tobacco comments:    1 pack daily  Substance and Sexual Activity   Alcohol  use: Yes    Alcohol /week: 1.0 standard drink of alcohol     Types: 1 Cans of beer per week   Drug use: Never   Sexual activity: Not on file

## 2023-12-22 ENCOUNTER — Ambulatory Visit: Payer: Medicare Other | Admitting: Orthopedic Surgery

## 2023-12-22 ENCOUNTER — Encounter: Payer: Self-pay | Admitting: Orthopedic Surgery

## 2023-12-22 DIAGNOSIS — I87331 Chronic venous hypertension (idiopathic) with ulcer and inflammation of right lower extremity: Secondary | ICD-10-CM | POA: Diagnosis not present

## 2023-12-22 NOTE — Progress Notes (Signed)
 Office Visit Note   Patient: Stephanie Pace           Date of Birth: [birth year]           MRN: [id number] Visit Date: 12/22/2023              Requested by: Duwayne Katheryn HERO, PA [address] [place],  [place] [address] PCP: Duwayne Katheryn HERO, [place]  Chief Complaint  Patient presents with   Right Leg - Follow-up      HPI: Patient is a 83 year old woman who presents in follow-up for venous insufficiency ulceration right lower extremity status post serial compression wraps.  Patient states she has not picked up her compression socks yet.  Assessment & Plan: Visit Diagnoses:  1. 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  Patient will obtain a size medium compression sock.  Wear this daily.  Follow-Up Instructions: Return in about 4 weeks (around 01/19/2024).   Ortho Exam  Patient is alert, oriented, no adenopathy, well-dressed, normal affect, normal respiratory effort. Examination the skin is wrinkling well the venous ulcers have healed there is no drainage no cellulitis.  The calf measures 32 cm in circumference.  She has completed her antibiotics.  Imaging: No results found. No images are attached to the encounter.  Labs: No results found for: HGBA1C, ESRSEDRATE, CRP, LABURIC, REPTSTATUS, GRAMSTAIN, CULT, LABORGA   Lab Results  Component Value Date   ALBUMIN 3.5 01/21/2021    Lab Results  Component Value Date   MG 1.5 (L) 01/21/2021   MG 1.8 02/03/2019   No results found for: VD25OH  No results found for: PREALBUMIN    Latest Ref Rng & Units 03/19/2021    3:31 PM 01/21/2021   11:38 AM 02/03/2019   10:24 AM  CBC EXTENDED  WBC 4.0 - 10.5 K/uL 5.2  6.6  5.7   RBC 3.87 - 5.11 MIL/uL 2.91  4.03  4.18   Hemoglobin 12.0 - 15.0 g/dL 8.6  89.0  87.9   HCT 63.9 - 46.0 % 27.1  35.0  38.3   Platelets 150 - 400 K/uL 290  367  291   NEUT# 1.7 - 7.7 K/uL  4.8  4.0    Lymph# 0.7 - 4.0 K/uL  1.1  1.0      There is no height or weight on file to calculate BMI.  Orders:  No orders of the defined types were placed in this encounter.  No orders of the defined types were placed in this encounter.    Procedures: No procedures performed  Clinical Data: No additional findings.  ROS:  All other systems negative, except as noted in the HPI. Review of Systems  Objective: Vital Signs: There were no vitals taken for this visit.  Specialty Comments:  No specialty comments available.  PMFS History: Patient Active Problem List   Diagnosis Date Noted   Persistent atrial fibrillation (HCC) 01/30/2021   Secondary hypercoagulable state (HCC) 01/30/2021   Hypokalemia 01/30/2015   Type 2 diabetes mellitus without complication (HCC) 08/07/2014   COPD (chronic obstructive pulmonary disease) (HCC) 07/11/2014   Renal insufficiency 07/11/2014   Sciatica 07/11/2014   Essential hypertension 04/04/2014   Generalized anxiety disorder 04/03/2014   Hyperlipidemia LDL goal <100 04/03/2014   Osteoporosis 04/03/2014   Past Medical History:  Diagnosis Date   Diabetes mellitus without complication (HCC)    Hypertension  History reviewed. No pertinent family history.  History reviewed. No pertinent surgical history. Social History   Occupational History   Not on file  Tobacco Use   Smoking status: Every Day    Types: Cigarettes   Smokeless tobacco: Never   Tobacco comments:    1 pack daily  Substance and Sexual Activity   Alcohol  use: Yes    Alcohol /week: 1.0 standard drink of alcohol     Types: 1 Cans of beer per week   Drug use: Never   Sexual activity: Not on file

## 2024-01-26 ENCOUNTER — Ambulatory Visit: Payer: Medicare Other | Admitting: Orthopedic Surgery

## 2024-01-26 DIAGNOSIS — I87331 Chronic venous hypertension (idiopathic) with ulcer and inflammation of right lower extremity: Secondary | ICD-10-CM | POA: Diagnosis not present

## 2024-01-27 ENCOUNTER — Encounter: Payer: Self-pay | Admitting: Orthopedic Surgery

## 2024-01-27 NOTE — Progress Notes (Signed)
 Office Visit Note   Patient: Stephanie Pace           Date of Birth: 1941/12/07           MRN: [id number] Visit Date: 01/26/2024              Requested by: Shellia Cleverly, PA [address] [place],  [place] [address] PCP: Shellia Cleverly, [place]  Chief Complaint  Patient presents with   Right Leg - Follow-up      HPI: Patient is an 83 year old woman who is seen in follow-up for venous insufficiency ulceration.  Assessment & Plan: Visit Diagnoses:  1. Chronic venous hypertension (idiopathic) with ulcer and inflammation of right lower extremity (HCC)     Plan: Recommended Dial soap cleansing moisturizing lotion and a size medium compression sock.  Follow-Up Instructions: Return if symptoms worsen or fail to improve.   Ortho Exam  Patient is alert, oriented, no adenopathy, well-dressed, normal affect, normal respiratory effort. Examination patient has dry flaky skin there is no cellulitis no drainage no ulcers.  Imaging: No results found. No images are attached to the encounter.  Labs: No results found for: "HGBA1C", "ESRSEDRATE", "CRP", "LABURIC", "REPTSTATUS", "GRAMSTAIN", "CULT", "LABORGA"   Lab Results  Component Value Date   ALBUMIN 3.5 01/21/2021    Lab Results  Component Value Date   MG 1.5 (L) 01/21/2021   MG 1.8 02/03/2019   No results found for: "VD25OH"  No results found for: "PREALBUMIN"    Latest Ref Rng & Units 03/19/2021    3:31 PM 01/21/2021   11:38 AM 02/03/2019   10:24 AM  CBC EXTENDED  WBC 4.0 - 10.5 K/uL 5.2  6.6  5.7   RBC 3.87 - 5.11 MIL/uL 2.91  4.03  4.18   Hemoglobin 12.0 - 15.0 g/dL 8.6  19.1  47.8   HCT 29.5 - 46.0 % 27.1  35.0  38.3   Platelets 150 - 400 K/uL 290  367  291   NEUT# 1.7 - 7.7 K/uL  4.8  4.0   Lymph# 0.7 - 4.0 K/uL  1.1  1.0      There is no height or weight on file to calculate BMI.  Orders:  No orders of the defined types were placed in this encounter.  No orders of the defined types were placed in this  encounter.    Procedures: No procedures performed  Clinical Data: No additional findings.  ROS:  All other systems negative, except as noted in the HPI. Review of Systems  Objective: Vital Signs: There were no vitals taken for this visit.  Specialty Comments:  No specialty comments available.  PMFS History: Patient Active Problem List   Diagnosis Date Noted   Persistent atrial fibrillation (HCC) 01/30/2021   Secondary hypercoagulable state (HCC) 01/30/2021   Hypokalemia 01/30/2015   Type 2 diabetes mellitus without complication (HCC) 08/07/2014   COPD (chronic obstructive pulmonary disease) (HCC) 07/11/2014   Renal insufficiency 07/11/2014   Sciatica 07/11/2014   Essential hypertension 04/04/2014   Generalized anxiety disorder 04/03/2014   Hyperlipidemia LDL goal <100 04/03/2014   Osteoporosis 04/03/2014   Past Medical History:  Diagnosis Date   Diabetes mellitus without complication (HCC)    Hypertension     History reviewed. No pertinent family history.  History reviewed. No pertinent surgical history. Social History   Occupational History   Not on file  Tobacco Use   Smoking status: Every Day    Types: Cigarettes   Smokeless tobacco:  Never   Tobacco comments:    1 pack daily  Substance and Sexual Activity   Alcohol use: Yes    Alcohol/week: 1.0 standard drink of alcohol    Types: 1 Cans of beer per week   Drug use: Never   Sexual activity: Not on file

## 2024-02-23 ENCOUNTER — Other Ambulatory Visit: Payer: Self-pay

## 2024-02-23 ENCOUNTER — Emergency Department (HOSPITAL_COMMUNITY)

## 2024-02-23 ENCOUNTER — Encounter (HOSPITAL_COMMUNITY)
Admission: EM | Disposition: A | Discharge status: Hospice | Payer: Self-pay | Source: Home / Self Care | Attending: Neurology

## 2024-02-23 ENCOUNTER — Inpatient Hospital Stay (HOSPITAL_COMMUNITY)

## 2024-02-23 ENCOUNTER — Emergency Department (HOSPITAL_COMMUNITY): Admitting: Certified Registered Nurse Anesthetist

## 2024-02-23 ENCOUNTER — Inpatient Hospital Stay (HOSPITAL_COMMUNITY)
Admission: EM | Admit: 2024-02-23 | Discharge: 2024-02-28 | DRG: 023 | Disposition: A | Discharge status: Hospice | Attending: Neurology | Admitting: Neurology

## 2024-02-23 DIAGNOSIS — E1122 Type 2 diabetes mellitus with diabetic chronic kidney disease: Secondary | ICD-10-CM | POA: Diagnosis present

## 2024-02-23 DIAGNOSIS — I10 Essential (primary) hypertension: Secondary | ICD-10-CM | POA: Diagnosis present

## 2024-02-23 DIAGNOSIS — I480 Paroxysmal atrial fibrillation: Secondary | ICD-10-CM

## 2024-02-23 DIAGNOSIS — R0682 Tachypnea, not elsewhere classified: Secondary | ICD-10-CM | POA: Diagnosis present

## 2024-02-23 DIAGNOSIS — Z7901 Long term (current) use of anticoagulants: Secondary | ICD-10-CM

## 2024-02-23 DIAGNOSIS — J449 Chronic obstructive pulmonary disease, unspecified: Secondary | ICD-10-CM

## 2024-02-23 DIAGNOSIS — Z720 Tobacco use: Secondary | ICD-10-CM | POA: Diagnosis not present

## 2024-02-23 DIAGNOSIS — R1310 Dysphagia, unspecified: Secondary | ICD-10-CM | POA: Diagnosis not present

## 2024-02-23 DIAGNOSIS — N182 Chronic kidney disease, stage 2 (mild): Secondary | ICD-10-CM | POA: Diagnosis present

## 2024-02-23 DIAGNOSIS — I129 Hypertensive chronic kidney disease with stage 1 through stage 4 chronic kidney disease, or unspecified chronic kidney disease: Secondary | ICD-10-CM | POA: Diagnosis present

## 2024-02-23 DIAGNOSIS — Z6832 Body mass index (BMI) 32.0-32.9, adult: Secondary | ICD-10-CM

## 2024-02-23 DIAGNOSIS — I4891 Unspecified atrial fibrillation: Secondary | ICD-10-CM | POA: Diagnosis not present

## 2024-02-23 DIAGNOSIS — E876 Hypokalemia: Secondary | ICD-10-CM | POA: Diagnosis present

## 2024-02-23 DIAGNOSIS — I639 Cerebral infarction, unspecified: Principal | ICD-10-CM | POA: Diagnosis present

## 2024-02-23 DIAGNOSIS — I161 Hypertensive emergency: Secondary | ICD-10-CM | POA: Diagnosis present

## 2024-02-23 DIAGNOSIS — E669 Obesity, unspecified: Secondary | ICD-10-CM | POA: Insufficient documentation

## 2024-02-23 DIAGNOSIS — R4701 Aphasia: Secondary | ICD-10-CM | POA: Diagnosis present

## 2024-02-23 DIAGNOSIS — E785 Hyperlipidemia, unspecified: Secondary | ICD-10-CM | POA: Insufficient documentation

## 2024-02-23 DIAGNOSIS — I6602 Occlusion and stenosis of left middle cerebral artery: Secondary | ICD-10-CM | POA: Diagnosis present

## 2024-02-23 DIAGNOSIS — R29724 NIHSS score 24: Secondary | ICD-10-CM | POA: Diagnosis not present

## 2024-02-23 DIAGNOSIS — R29810 Facial weakness: Secondary | ICD-10-CM | POA: Diagnosis present

## 2024-02-23 DIAGNOSIS — G8191 Hemiplegia, unspecified affecting right dominant side: Secondary | ICD-10-CM | POA: Diagnosis present

## 2024-02-23 DIAGNOSIS — E1165 Type 2 diabetes mellitus with hyperglycemia: Secondary | ICD-10-CM | POA: Diagnosis present

## 2024-02-23 DIAGNOSIS — D72829 Elevated white blood cell count, unspecified: Secondary | ICD-10-CM | POA: Diagnosis present

## 2024-02-23 DIAGNOSIS — R29722 NIHSS score 22: Secondary | ICD-10-CM

## 2024-02-23 DIAGNOSIS — F411 Generalized anxiety disorder: Secondary | ICD-10-CM | POA: Diagnosis present

## 2024-02-23 DIAGNOSIS — I6389 Other cerebral infarction: Secondary | ICD-10-CM | POA: Diagnosis not present

## 2024-02-23 DIAGNOSIS — D649 Anemia, unspecified: Secondary | ICD-10-CM | POA: Insufficient documentation

## 2024-02-23 DIAGNOSIS — Z515 Encounter for palliative care: Secondary | ICD-10-CM

## 2024-02-23 DIAGNOSIS — I609 Nontraumatic subarachnoid hemorrhage, unspecified: Secondary | ICD-10-CM | POA: Diagnosis not present

## 2024-02-23 DIAGNOSIS — R29725 NIHSS score 25: Secondary | ICD-10-CM | POA: Diagnosis not present

## 2024-02-23 DIAGNOSIS — I63512 Cerebral infarction due to unspecified occlusion or stenosis of left middle cerebral artery: Secondary | ICD-10-CM | POA: Diagnosis not present

## 2024-02-23 DIAGNOSIS — J9600 Acute respiratory failure, unspecified whether with hypoxia or hypercapnia: Secondary | ICD-10-CM | POA: Diagnosis not present

## 2024-02-23 DIAGNOSIS — J9589 Other postprocedural complications and disorders of respiratory system, not elsewhere classified: Secondary | ICD-10-CM | POA: Diagnosis not present

## 2024-02-23 DIAGNOSIS — Z7982 Long term (current) use of aspirin: Secondary | ICD-10-CM

## 2024-02-23 DIAGNOSIS — E119 Type 2 diabetes mellitus without complications: Secondary | ICD-10-CM | POA: Diagnosis not present

## 2024-02-23 DIAGNOSIS — I63412 Cerebral infarction due to embolism of left middle cerebral artery: Principal | ICD-10-CM

## 2024-02-23 DIAGNOSIS — I69351 Hemiplegia and hemiparesis following cerebral infarction affecting right dominant side: Secondary | ICD-10-CM | POA: Diagnosis not present

## 2024-02-23 DIAGNOSIS — R471 Dysarthria and anarthria: Secondary | ICD-10-CM | POA: Diagnosis present

## 2024-02-23 DIAGNOSIS — F17210 Nicotine dependence, cigarettes, uncomplicated: Secondary | ICD-10-CM | POA: Diagnosis present

## 2024-02-23 DIAGNOSIS — Z7984 Long term (current) use of oral hypoglycemic drugs: Secondary | ICD-10-CM

## 2024-02-23 DIAGNOSIS — Z79899 Other long term (current) drug therapy: Secondary | ICD-10-CM

## 2024-02-23 DIAGNOSIS — Z8673 Personal history of transient ischemic attack (TIA), and cerebral infarction without residual deficits: Secondary | ICD-10-CM

## 2024-02-23 DIAGNOSIS — D631 Anemia in chronic kidney disease: Secondary | ICD-10-CM | POA: Diagnosis present

## 2024-02-23 DIAGNOSIS — I272 Pulmonary hypertension, unspecified: Secondary | ICD-10-CM | POA: Diagnosis present

## 2024-02-23 DIAGNOSIS — R29726 NIHSS score 26: Secondary | ICD-10-CM | POA: Diagnosis not present

## 2024-02-23 DIAGNOSIS — Z66 Do not resuscitate: Secondary | ICD-10-CM | POA: Diagnosis not present

## 2024-02-23 DIAGNOSIS — R29737 NIHSS score 37: Secondary | ICD-10-CM | POA: Diagnosis not present

## 2024-02-23 DIAGNOSIS — Z634 Disappearance and death of family member: Secondary | ICD-10-CM

## 2024-02-23 DIAGNOSIS — Z91148 Patient's other noncompliance with medication regimen for other reason: Secondary | ICD-10-CM

## 2024-02-23 DIAGNOSIS — Z7189 Other specified counseling: Secondary | ICD-10-CM | POA: Diagnosis not present

## 2024-02-23 DIAGNOSIS — F17200 Nicotine dependence, unspecified, uncomplicated: Secondary | ICD-10-CM | POA: Insufficient documentation

## 2024-02-23 HISTORY — PX: RADIOLOGY WITH ANESTHESIA: SHX6223

## 2024-02-23 HISTORY — PX: IR CT HEAD LTD: IMG2386

## 2024-02-23 HISTORY — PX: IR PERCUTANEOUS ART THROMBECTOMY/INFUSION INTRACRANIAL INC DIAG ANGIO: IMG6087

## 2024-02-23 LAB — GLUCOSE, CAPILLARY
Glucose-Capillary: 209 mg/dL — ABNORMAL HIGH (ref 70–99)
Glucose-Capillary: 138 mg/dL — ABNORMAL HIGH (ref 70–99)

## 2024-02-23 LAB — CBC
HCT: 34.6 % — ABNORMAL LOW (ref 36.0–46.0)
Hemoglobin: 10.9 g/dL — ABNORMAL LOW (ref 12.0–15.0)
MCH: 29.9 pg (ref 26.0–34.0)
MCHC: 31.5 g/dL (ref 30.0–36.0)
MCV: 95.1 fL (ref 80.0–100.0)
Platelets: 268 10*3/uL (ref 150–400)
RBC: 3.64 MIL/uL — ABNORMAL LOW (ref 3.87–5.11)
RDW: 15.9 % — ABNORMAL HIGH (ref 11.5–15.5)
WBC: 8.2 10*3/uL (ref 4.0–10.5)
nRBC: 0 % (ref 0.0–0.2)

## 2024-02-23 LAB — I-STAT CHEM 8, ED
BUN: 15 mg/dL (ref 8–23)
Calcium, Ion: 1.1 mmol/L — ABNORMAL LOW (ref 1.15–1.40)
Chloride: 99 mmol/L (ref 98–111)
Creatinine, Ser: 0.7 mg/dL (ref 0.44–1.00)
Glucose, Bld: 220 mg/dL — ABNORMAL HIGH (ref 70–99)
HCT: 35 % — ABNORMAL LOW (ref 36.0–46.0)
Hemoglobin: 11.9 g/dL — ABNORMAL LOW (ref 12.0–15.0)
Potassium: 3.3 mmol/L — ABNORMAL LOW (ref 3.5–5.1)
Sodium: 136 mmol/L (ref 135–145)
TCO2: 25 mmol/L (ref 22–32)

## 2024-02-23 LAB — POCT I-STAT 7, (LYTES, BLD GAS, ICA,H+H)
Acid-Base Excess: 0 mmol/L (ref 0.0–2.0)
Bicarbonate: 27.7 mmol/L (ref 20.0–28.0)
Calcium, Ion: 1.17 mmol/L (ref 1.15–1.40)
HCT: 34 % — ABNORMAL LOW (ref 36.0–46.0)
Hemoglobin: 11.6 g/dL — ABNORMAL LOW (ref 12.0–15.0)
O2 Saturation: 96 %
Patient temperature: 36.4
Potassium: 3.4 mmol/L — ABNORMAL LOW (ref 3.5–5.1)
Sodium: 137 mmol/L (ref 135–145)
TCO2: 29 mmol/L (ref 22–32)
pCO2 arterial: 55 mmHg — ABNORMAL HIGH (ref 32–48)
pH, Arterial: 7.307 — ABNORMAL LOW (ref 7.35–7.45)
pO2, Arterial: 87 mmHg (ref 83–108)

## 2024-02-23 LAB — DIFFERENTIAL
Abs Immature Granulocytes: 0.04 10*3/uL (ref 0.00–0.07)
Basophils Absolute: 0 10*3/uL (ref 0.0–0.1)
Basophils Relative: 1 %
Eosinophils Absolute: 0.1 10*3/uL (ref 0.0–0.5)
Eosinophils Relative: 1 %
Immature Granulocytes: 1 %
Lymphocytes Relative: 7 %
Lymphs Abs: 0.6 10*3/uL — ABNORMAL LOW (ref 0.7–4.0)
Monocytes Absolute: 0.6 10*3/uL (ref 0.1–1.0)
Monocytes Relative: 7 %
Neutro Abs: 6.9 10*3/uL (ref 1.7–7.7)
Neutrophils Relative %: 83 %

## 2024-02-23 LAB — CBG MONITORING, ED: Glucose-Capillary: 214 mg/dL — ABNORMAL HIGH (ref 70–99)

## 2024-02-23 LAB — APTT: aPTT: 25 s (ref 24–36)

## 2024-02-23 LAB — COMPREHENSIVE METABOLIC PANEL
ALT: 14 U/L (ref 0–44)
AST: 20 U/L (ref 15–41)
Albumin: 3.7 g/dL (ref 3.5–5.0)
Alkaline Phosphatase: 61 U/L (ref 38–126)
Anion gap: 11 (ref 5–15)
BUN: 15 mg/dL (ref 8–23)
CO2: 25 mmol/L (ref 22–32)
Calcium: 8.9 mg/dL (ref 8.9–10.3)
Chloride: 101 mmol/L (ref 98–111)
Creatinine, Ser: 0.78 mg/dL (ref 0.44–1.00)
GFR, Estimated: >60 mL/min (ref >60)
Glucose, Bld: 217 mg/dL — ABNORMAL HIGH (ref 70–99)
Potassium: 3.4 mmol/L — ABNORMAL LOW (ref 3.5–5.1)
Sodium: 137 mmol/L (ref 135–145)
Total Bilirubin: 1 mg/dL (ref 0.0–1.2)
Total Protein: 7.1 g/dL (ref 6.5–8.1)

## 2024-02-23 LAB — RESP PANEL BY RT-PCR (RSV, FLU A&B, COVID)  RVPGX2
Influenza A by PCR: NEGATIVE
Influenza B by PCR: NEGATIVE
Resp Syncytial Virus by PCR: NEGATIVE
SARS Coronavirus 2 by RT PCR: NEGATIVE

## 2024-02-23 LAB — MRSA NEXT GEN BY PCR, NASAL: MRSA by PCR Next Gen: NOT DETECTED

## 2024-02-23 LAB — HEMOGLOBIN A1C
Hgb A1c MFr Bld: 6.1 % — ABNORMAL HIGH (ref 4.8–5.6)
Mean Plasma Glucose: 128.37 mg/dL

## 2024-02-23 LAB — MAGNESIUM: Magnesium: 1.6 mg/dL — ABNORMAL LOW (ref 1.7–2.4)

## 2024-02-23 LAB — PROTIME-INR
INR: 1.4 — ABNORMAL HIGH (ref 0.8–1.2)
Prothrombin Time: 17.6 s — ABNORMAL HIGH (ref 11.4–15.2)

## 2024-02-23 LAB — ETHANOL: Alcohol, Ethyl (B): <10 mg/dL (ref <10)

## 2024-02-23 LAB — CK: Total CK: 97 U/L (ref 38–234)

## 2024-02-23 SURGERY — RADIOLOGY WITH ANESTHESIA
Anesthesia: General

## 2024-02-23 MED ORDER — ORAL CARE MOUTH RINSE: 15.0000 mL | OROMUCOSAL | Status: DC | PRN

## 2024-02-23 MED ORDER — SODIUM CHLORIDE 0.9 % IV SOLN: INTRAVENOUS | Status: DC

## 2024-02-23 MED ORDER — PANTOPRAZOLE SODIUM 40 MG IV SOLR
40.0000 mg | Freq: Every day | INTRAVENOUS | Status: DC
  Administered 2024-02-23 – 2024-02-26 (×4): 40 mg via INTRAVENOUS
  Filled 2024-02-23 (×4): qty 10

## 2024-02-23 MED ORDER — ORAL CARE MOUTH RINSE
15.0000 mL | OROMUCOSAL | Status: DC
Start: 2024-02-23 — End: 2024-02-23
  Administered 2024-02-23: 15 mL via OROMUCOSAL

## 2024-02-23 MED ORDER — BUDESONIDE 0.5 MG/2ML IN SUSP
0.5000 mg | Freq: Two times a day (BID) | RESPIRATORY_TRACT | Status: DC
  Administered 2024-02-23 – 2024-02-26 (×6): 0.5 mg via RESPIRATORY_TRACT
  Filled 2024-02-23 (×6): qty 2

## 2024-02-23 MED ORDER — ACETAMINOPHEN 325 MG PO TABS: 650.0000 mg | ORAL_TABLET | ORAL | Status: DC | PRN

## 2024-02-23 MED ORDER — SODIUM CHLORIDE 0.9 % IV SOLN: INTRAVENOUS | Status: DC | PRN

## 2024-02-23 MED ORDER — POLYETHYLENE GLYCOL 3350 17 G PO PACK
17.0000 g | PACK | Freq: Every day | ORAL | Status: DC
  Administered 2024-02-24 – 2024-02-26 (×3): 17 g
  Filled 2024-02-23 (×3): qty 1

## 2024-02-23 MED ORDER — ALBUTEROL SULFATE (2.5 MG/3ML) 0.083% IN NEBU
2.5000 mg | INHALATION_SOLUTION | RESPIRATORY_TRACT | Status: DC | PRN
Start: 2024-02-23 — End: 2024-02-26
  Administered 2024-02-26: 2.5 mg via RESPIRATORY_TRACT
  Filled 2024-02-23: qty 3

## 2024-02-23 MED ORDER — ACETAMINOPHEN 650 MG RE SUPP: 650.0000 mg | RECTAL | Status: DC | PRN

## 2024-02-23 MED ORDER — ACETAMINOPHEN 160 MG/5ML PO SOLN
650.0000 mg | ORAL | Status: DC | PRN
  Administered 2024-02-24 – 2024-02-26 (×3): 650 mg
  Filled 2024-02-23 (×3): qty 20.3

## 2024-02-23 MED ORDER — AMLODIPINE BESYLATE 5 MG PO TABS
5.0000 mg | ORAL_TABLET | Freq: Every day | ORAL | Status: DC
  Administered 2024-02-23: 5 mg
  Filled 2024-02-23: qty 1

## 2024-02-23 MED ORDER — SENNOSIDES-DOCUSATE SODIUM 8.6-50 MG PO TABS: 1.0000 | ORAL_TABLET | Freq: Every evening | ORAL | Status: DC | PRN

## 2024-02-23 MED ORDER — FENTANYL CITRATE PF 50 MCG/ML IJ SOSY
25.0000 ug | PREFILLED_SYRINGE | INTRAMUSCULAR | Status: DC | PRN
  Administered 2024-02-23: 100 ug via INTRAVENOUS
  Filled 2024-02-23: qty 2

## 2024-02-23 MED ORDER — SUCCINYLCHOLINE CHLORIDE 200 MG/10ML IV SOSY
PREFILLED_SYRINGE | INTRAVENOUS | Status: DC | PRN
  Administered 2024-02-23: 80 mg via INTRAVENOUS

## 2024-02-23 MED ORDER — CLEVIDIPINE BUTYRATE 0.5 MG/ML IV EMUL
0.0000 mg/h | INTRAVENOUS | Status: DC
  Administered 2024-02-23: 7 mg/h via INTRAVENOUS
  Filled 2024-02-23: qty 50

## 2024-02-23 MED ORDER — ROCURONIUM BROMIDE 10 MG/ML (PF) SYRINGE
PREFILLED_SYRINGE | INTRAVENOUS | Status: DC | PRN
  Administered 2024-02-23: 50 mg via INTRAVENOUS

## 2024-02-23 MED ORDER — REVEFENACIN 175 MCG/3ML IN SOLN
175.0000 ug | Freq: Every day | RESPIRATORY_TRACT | Status: DC
  Administered 2024-02-24 – 2024-02-26 (×3): 175 ug via RESPIRATORY_TRACT
  Filled 2024-02-23 (×3): qty 3

## 2024-02-23 MED ORDER — PROPOFOL 10 MG/ML IV BOLUS
INTRAVENOUS | Status: DC | PRN
  Administered 2024-02-23: 100 mg via INTRAVENOUS
  Administered 2024-02-23: 25 ug/kg/min via INTRAVENOUS

## 2024-02-23 MED ORDER — IOHEXOL 350 MG/ML SOLN
100.0000 mL | Freq: Once | INTRAVENOUS | Status: AC | PRN
  Administered 2024-02-23: 100 mL via INTRAVENOUS

## 2024-02-23 MED ORDER — CLEVIDIPINE BUTYRATE 0.5 MG/ML IV EMUL
INTRAVENOUS | Status: AC
  Filled 2024-02-23: qty 50

## 2024-02-23 MED ORDER — DOCUSATE SODIUM 50 MG/5ML PO LIQD
100.0000 mg | Freq: Two times a day (BID) | ORAL | Status: DC
  Administered 2024-02-23 – 2024-02-26 (×5): 100 mg
  Filled 2024-02-23 (×5): qty 10

## 2024-02-23 MED ORDER — CHLORHEXIDINE GLUCONATE CLOTH 2 % EX PADS
6.0000 | MEDICATED_PAD | Freq: Every day | CUTANEOUS | Status: DC
  Administered 2024-02-24 – 2024-02-26 (×3): 6 via TOPICAL

## 2024-02-23 MED ORDER — ONDANSETRON HCL 4 MG/2ML IJ SOLN
INTRAMUSCULAR | Status: DC | PRN
  Administered 2024-02-23: 4 mg via INTRAVENOUS

## 2024-02-23 MED ORDER — ORAL CARE MOUTH RINSE
15.0000 mL | OROMUCOSAL | Status: DC
  Administered 2024-02-23 – 2024-02-26 (×34): 15 mL via OROMUCOSAL

## 2024-02-23 MED ORDER — ORAL CARE MOUTH RINSE: 15.0000 mL | OROMUCOSAL | Status: DC

## 2024-02-23 MED ORDER — STROKE: EARLY STAGES OF RECOVERY BOOK
Freq: Once | Status: AC
  Filled 2024-02-23: qty 1

## 2024-02-23 MED ORDER — INSULIN ASPART 100 UNIT/ML IJ SOLN
0.0000 [IU] | INTRAMUSCULAR | Status: DC
  Administered 2024-02-23: 2 [IU] via SUBCUTANEOUS
  Administered 2024-02-23: 5 [IU] via SUBCUTANEOUS

## 2024-02-23 MED ORDER — SODIUM CHLORIDE (PF) 0.9 % IJ SOLN
INTRAVENOUS | Status: AC | PRN
  Administered 2024-02-23 (×5): 25 ug via INTRA_ARTERIAL

## 2024-02-23 MED ORDER — PROPOFOL 1000 MG/100ML IV EMUL
0.0000 ug/kg/min | INTRAVENOUS | Status: DC
Start: 2024-02-23 — End: 2024-02-24
  Administered 2024-02-23: 35 ug/kg/min via INTRAVENOUS
  Administered 2024-02-23 – 2024-02-24 (×2): 45 ug/kg/min via INTRAVENOUS
  Administered 2024-02-24: 35 ug/kg/min via INTRAVENOUS
  Filled 2024-02-23 (×4): qty 100

## 2024-02-23 MED ORDER — ARFORMOTEROL TARTRATE 15 MCG/2ML IN NEBU
15.0000 ug | INHALATION_SOLUTION | Freq: Two times a day (BID) | RESPIRATORY_TRACT | Status: DC
  Administered 2024-02-23 – 2024-02-26 (×6): 15 ug via RESPIRATORY_TRACT
  Filled 2024-02-23 (×6): qty 2

## 2024-02-23 MED ORDER — FENTANYL CITRATE PF 50 MCG/ML IJ SOSY
25.0000 ug | PREFILLED_SYRINGE | INTRAMUSCULAR | Status: DC | PRN
  Administered 2024-02-23: 25 ug via INTRAVENOUS
  Filled 2024-02-23: qty 1

## 2024-02-23 MED ORDER — IOHEXOL 300 MG/ML  SOLN
150.0000 mL | Freq: Once | INTRAMUSCULAR | Status: AC | PRN
  Administered 2024-02-23: 60 mL via INTRA_ARTERIAL

## 2024-02-23 MED ORDER — CHLORHEXIDINE GLUCONATE CLOTH 2 % EX PADS
6.0000 | MEDICATED_PAD | Freq: Every day | CUTANEOUS | Status: DC
  Administered 2024-02-23 – 2024-02-25 (×3): 6 via TOPICAL

## 2024-02-23 MED ORDER — CLONIDINE HCL 0.1 MG PO TABS
0.1000 mg | ORAL_TABLET | Freq: Two times a day (BID) | ORAL | Status: DC
  Administered 2024-02-23: 0.1 mg
  Filled 2024-02-23: qty 1

## 2024-02-23 MED ORDER — POTASSIUM CHLORIDE 20 MEQ PO PACK
40.0000 meq | PACK | ORAL | Status: AC
  Administered 2024-02-23 (×2): 40 meq
  Filled 2024-02-23 (×2): qty 2

## 2024-02-23 MED ORDER — CLEVIDIPINE BUTYRATE 0.5 MG/ML IV EMUL
INTRAVENOUS | Status: DC | PRN
  Administered 2024-02-23: 1 mg/h via INTRAVENOUS

## 2024-02-23 MED ORDER — NITROGLYCERIN 1 MG/10 ML FOR IR/CATH LAB
INTRA_ARTERIAL | Status: AC
  Filled 2024-02-23: qty 10

## 2024-02-23 MED ORDER — LIDOCAINE 2% (20 MG/ML) 5 ML SYRINGE
INTRAMUSCULAR | Status: DC | PRN
  Administered 2024-02-23: 100 mg via INTRAVENOUS

## 2024-02-23 MED ORDER — ACETAMINOPHEN 160 MG/5ML PO SOLN: 650.0000 mg | ORAL | Status: DC | PRN

## 2024-02-23 NOTE — H&P (Signed)
 NEUROLOGY H&P NOTE   Date of service: February 23, 2024 Patient Name: Stephanie Pace MRN:  [id number] DOB:  02/12/1941 Chief Complaint: "Code Stroke"  History of Present Illness  Stephanie Pace is an 83 y.o. female with a PMHx of HTN, DM, afib not on anticoagulation found on the ground this morning by her daughter. Last known normal 0330 and her daughter thought she heard her around 0900, but found by daughter at 34 on the ground near her bed. On arrival she has right sided weakness, left gaze deviation, and global aphasia. Per step daughter Marcene Brawn) at the bedside, she is independent at baseline. Her daughter and daughter's boyfriend do live in the basement.  Patient did recently have a "mini stroke" with mild right sided residual weakness. She is supposed to be on Eliquis; however, she is not currently taking it. EMS found one bottle of Eliquis from 2022, no fills documented in our system. Daughter states that she is only taking her BP medications.   Last known well: 0330 Modified rankin score: 2-Slight disability-UNABLE to perform all activities but does not need assistance IV Thrombolysis: No, outside of the time window Thrombectomy: Yes - Daughter Marcelino Duster) consents for IR via phone with Dr. Derry Lory. Risks, benefits and alternatives of IVT discussed with patient and/or family and they agreed.  NIHSS components Score: Comment  1a Level of Conscious 0[]  1[x]  2[]  3[]      1b LOC Questions 0[]  1[]  2[x]       1c LOC Commands 0[]  1[]  2[x]       2 Best Gaze 0[]  1[]  2[x]       3 Visual 0[]  1[]  2[x]  3[]      4 Facial Palsy 0[]  1[]  2[x]  3[]      5a Motor Arm - left 0[x]  1[]  2[]  3[]  4[]  UN[]    5b Motor Arm - Right 0[]  1[]  2[]  3[x]  4[]  UN[]    6a Motor Leg - Left 0[x]  1[]  2[]  3[]  4[]  UN[]    6b Motor Leg - Right 0[]  1[]  2[]  3[x]  4[]  UN[]    7 Limb Ataxia 0[x]  1[]  2[]  3[]  UN[]     8 Sensory 0[]  1[x]  2[]  UN[]      9 Best Language 0[]  1[]  2[x]  3[]      10 Dysarthria 0[]  1[]  2[x]  UN[]      11 Extinct. and  Inattention 0[x]  1[]  2[]       TOTAL: 22      ROS  Comprehensive ROS performed and pertinent positives documented in the HPI  Past History   Past Medical History:  Diagnosis Date   Diabetes mellitus without complication (HCC)    Hypertension    No past surgical history on file. No family history on file. Social History   Socioeconomic History   Marital status: Married    Spouse name: Not on file   Number of children: Not on file   Years of education: Not on file   Highest education level: Not on file  Occupational History   Not on file  Tobacco Use   Smoking status: Every Day    Types: Cigarettes   Smokeless tobacco: Never   Tobacco comments:    1 pack daily  Substance and Sexual Activity   Alcohol use: Yes    Alcohol/week: 1.0 standard drink of alcohol    Types: 1 Cans of beer per week   Drug use: Never   Sexual activity: Not on file  Other Topics Concern   Not on file  Social History Narrative   Not on file   Social  Drivers of Corporate investment banker Strain: Not on file  Food Insecurity: Not on file  Transportation Needs: Not on file  Physical Activity: Not on file  Stress: Not on file  Social Connections: Not on file   No Known Allergies  Medications  No current facility-administered medications for this encounter.  Current Outpatient Medications:    acetaminophen (TYLENOL) 500 MG tablet, Take 500 mg by mouth every 6 (six) hours as needed for mild pain or moderate pain., Disp: , Rfl:    apixaban (ELIQUIS) 5 MG TABS tablet, Take 1 tablet (5 mg total) by mouth 2 (two) times daily., Disp: 60 tablet, Rfl: 3   atorvastatin (LIPITOR) 20 MG tablet, Take by mouth., Disp: , Rfl:    calcium citrate-vitamin D (CITRACAL+D) 315-200 MG-UNIT tablet, Take by mouth., Disp: , Rfl:    carvedilol (COREG) 25 MG tablet, , Disp: , Rfl:    doxycycline (VIBRA-TABS) 100 MG tablet, Take 1 tablet (100 mg total) by mouth 2 (two) times daily., Disp: 60 tablet, Rfl: 0    erythromycin ophthalmic ointment, SMARTSIG:Sparingly In Eye(s) 4 Times Daily, Disp: , Rfl:    fenofibrate 160 MG tablet, , Disp: , Rfl:    fluticasone (FLONASE) 50 MCG/ACT nasal spray, Place 2 sprays into both nostrils daily for 7 doses., Disp: 11.1 mL, Rfl: 0   hydrALAZINE (APRESOLINE) 25 MG tablet, Take 1 tablet (25 mg total) by mouth 2 (two) times daily as needed., Disp: 30 tablet, Rfl: 0   HYDROcodone-acetaminophen (NORCO/VICODIN) 5-325 MG tablet, Take 1 tablet by mouth every 6 (six) hours as needed., Disp: 30 tablet, Rfl: 0   MAG-G 500 (27 Mg) MG TABS, SMARTSIG:1 Tablet(s) By Mouth, Disp: , Rfl:    metFORMIN (GLUCOPHAGE) 500 MG tablet, , Disp: , Rfl:    moxifloxacin (VIGAMOX) 0.5 % ophthalmic solution, Apply 1 drop to eye 4 (four) times daily., Disp: , Rfl:    oxybutynin (DITROPAN-XL) 10 MG 24 hr tablet, Take 10 mg by mouth at bedtime., Disp: , Rfl:    pantoprazole (PROTONIX) 40 MG tablet, Take 40 mg by mouth daily., Disp: , Rfl:    pioglitazone (ACTOS) 30 MG tablet, TAKE 1 TABLET BY MOUTH DAILY, Disp: , Rfl:    potassium chloride (KLOR-CON) 10 MEQ tablet, Take 2 tablets (20 mEq total) by mouth daily., Disp: 60 tablet, Rfl: 3   valACYclovir (VALTREX) 1000 MG tablet, Take 1,000 mg by mouth 3 (three) times daily., Disp: , Rfl:    valsartan-hydrochlorothiazide (DIOVAN-HCT) 320-25 MG tablet, TK 1 T PO D, Disp: , Rfl:    Vitals   Vitals:   02/23/24 1513  Weight: 84.8 kg     Body mass index is 32.09 kg/m.  Physical Exam   Constitutional: Appears well-developed and well-nourished.  Psych: Affect appropriate to situation.  Eyes: No scleral injection.  HENT: No OP obstruction.  Head: Normocephalic.  Cardiovascular: Normal rate and regular rhythm.  Respiratory: Increase work of breathing, [place] in place GI: Soft.  No distension. There is no tenderness.  Skin: WDI.   Neurologic Examination   Mental Status: Opens eyes to voice, incomprehensible speech. Does follow some commands Cranial  Nerves: II: Right hemianopia III,IV, VI: Left gaze deviation V: Facial sensation is symmetric to temperature VII: Right facial droop VIII: Hearing is intact to voice X: Palate elevates symmetrically XI: Head midline XII: Tongue protrudes midline without atrophy or fasciculations.  Motor: Tone is normal. Bulk is normal. Right hemiparesis, trace movement, unable to elevate antigravity Full strength in  left upper and lower extremity Sensory: Withdraws more on the left than the right  Cerebellar: No ataxia out of proportion to weakness   Labs   CBC: No results for input(s): "WBC", "NEUTROABS", "HGB", "HCT", "MCV", "PLT" in the last 168 hours. Basic Metabolic Panel:  Lab Results  Component Value Date   NA 132 (L) 03/19/2021   K 2.9 (L) 03/19/2021   CO2 32 03/19/2021   GLUCOSE 117 (H) 03/19/2021   BUN 11 03/19/2021   CREATININE 0.66 03/19/2021   CALCIUM 8.9 03/19/2021   GFRNONAA >60 03/19/2021   GFRAA >60 02/03/2019    CT Head without contrast: 1. Acute left MCA infarct. ASPECTS of 7. 2. No acute intracranial hemorrhage. 3. Chronic right cerebellar infarct.   CT angio Head and Neck with perfusion: 1. Acute proximal left M2 occlusion. 2. 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 3. Unchanged intracranial atherosclerosis including mild bilateral ICA, moderate bilateral P2, and moderate to severe right V4 stenoses. 4. Widely patent cervical carotid arteries. 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 5. Suspected mild pulmonary edema in the included lung apices. 6.  Aortic atherosclerosis.  Assessment   Darthula Desa is a 83 y.o. female with right sided weakness, aphasia and left gaze deviation who was found to have a left M2 occlusion. Code IR activated and patient brought to IR for mechanical thrombectomy after informed consent obtained from family. Plan to admit to neuro ICU post thrombectomy for close neuro monitoring.   Primary  Diagnosis:  Cerebral infarction due to embolism of  left middle cerebral artery.   Secondary Diagnosis: Essential (primary) hypertension and Paroxysmal atrial fibrillation  Plan  - Admit to ICU - Post-VIR order set to include frequent neuro checks and BP management.  - No antiplatelet medications or anticoagulants for at least 24 hours following TNK.  - DVT prophylaxis with SCDs.  - Continue her atorvastatin. May need to increase to 40 mg po every day.    - Will need to be restarted on anticoagulation for her atrial fibrillation if follow up CT at 24 hours is negative for hemorrhagic conversion. - TTE.  - MRI brain  - PT/OT/Speech.  - NPO until passes swallow evaluation.  - Sliding scale insulin.  - Telemetry monitoring - HgbA1c, fasting lipid panel - Risk factor modification - Stroke team to follow  ______________________________________________________________________   Signed, Elmer Picker, NP [facility]  I have seen and examined the patient. I have formulated the assessment and recommendations 83 year old female with a history of atrial fibrillation, DM and HTN, non-compliant with Eliquis, who presents with acute stroke secondary to left M2 occlusion. Exam with NIHSS of 22. Code IR activated and patient brought to IR for mechanical thrombectomy after informed consent obtained from family. Plan to admit to neuro ICU post thrombectomy for close neuro monitoring.  Electronically signed: Dr. Caryl Pina

## 2024-02-23 NOTE — Sedation Documentation (Signed)
 Handoff with 4N ICU.  Patient's right fem site remains level 0 with dressing of quick clot, tegaderm, and pressure dressing clean/dry/intact. Pulses remain unchanged.

## 2024-02-23 NOTE — Anesthesia Preprocedure Evaluation (Addendum)
 Anesthesia Evaluation  Patient identified by MRN, date of birth, ID bandPreop documentation limited or incomplete due to emergent nature of procedure.  Airway Mallampati: III       Dental  (+) Edentulous Upper, Edentulous Lower   Pulmonary COPD, Current Smoker   Pulmonary exam normal        Cardiovascular hypertension, Pt. on home beta blockers and Pt. on medications Normal cardiovascular exam+ dysrhythmias Atrial Fibrillation      Neuro/Psych  PSYCHIATRIC DISORDERS Anxiety      Neuromuscular disease CVA    GI/Hepatic   Endo/Other  diabetes, Oral Hypoglycemic Agents  Glucose: 220  Renal/GU Renal disease     Musculoskeletal   Abdominal   Peds  Hematology  (+) Blood dyscrasia, anemia INR: 1.4   Anesthesia Other Findings CODE STROKE  Reproductive/Obstetrics                             Anesthesia Physical Anesthesia Plan  ASA: 3 and emergent  Anesthesia Plan: General   Post-op Pain Management:    Induction: Intravenous and Rapid sequence  PONV Risk Score and Plan: 2 and Ondansetron, Dexamethasone and Treatment may vary due to age or medical condition  Airway Management Planned: Oral ETT  Additional Equipment: Arterial line  Intra-op Plan:   Post-operative Plan: Possible Post-op intubation/ventilation  Informed Consent:      Only emergency history available  Plan Discussed with: CRNA  Anesthesia Plan Comments:        Anesthesia Quick Evaluation

## 2024-02-23 NOTE — Code Documentation (Signed)
 Stroke Response Nurse Documentation Code Documentation  Stephanie Pace is a 83 y.o. female arriving to [facility]  via [facility] on 02/23/2024 with past medical hx of HTN, DM, afub not on anticoagulation. On No antithrombotic. Code stroke was activated by EMS.   Patient from home where she was LKW at 0330 and now complaining of garbled speech, and right deficits. Per EMS, patients daughter found her on the floor at 1330 today with garbled speech and right sided deficits outside of her current baseline.  Stroke team at the bedside on patient arrival. Labs drawn and patient cleared for CT by EDP. Patient to CT with team. NIHSS 22, see documentation for details and code stroke times. Patient with decreased LOC, disoriented, not following commands, left gaze preference , right hemianopia, right facial droop, right arm weakness, right leg weakness, right decreased sensation, Global aphasia , and dysarthria  on exam. The following imaging was completed:  CT Head, CTA, and CTP. Patient is not a candidate for IV Thrombolytic due to outside of window per MD. Patient is a candidate for IR due to LVO noted on imaging per MD.   Care Plan: Patient transferred to IR.   Bedside handoff with IR RN Katie.    Felecia Jan  Stroke Response RN

## 2024-02-23 NOTE — ED Provider Notes (Signed)
 [place] EMERGENCY DEPARTMENT AT [facility] Provider Note   CSN: [id number] Arrival date & time: 02/23/24  1507     History  No chief complaint on file.   Stephanie Pace is a 83 y.o. female hx of CKD, A-fib on Eliquis, previous stroke with right-sided deficit here presenting with code stroke.  Patient's last normal was around 3:30 AM.  Daughter woke up and her turned around 9 AM and found her on the ground around 2 pm.  Patient's NIH was 22.  EMS activated code stroke.  Patient was noted to have left eye deviation and worsening right-sided weakness.  Patient was seen by neurology on arrival.   The history is provided by the patient.       Home Medications Prior to Admission medications   Medication Sig Start Date End Date Taking? Authorizing Provider  acetaminophen (TYLENOL) 500 MG tablet Take 500 mg by mouth every 6 (six) hours as needed for mild pain or moderate pain.    [provider]  apixaban (ELIQUIS) 5 MG TABS tablet Take 1 tablet (5 mg total) by mouth 2 (two) times daily. 03/29/21 04/28/21  Fenton, Clint R, PA  atorvastatin (LIPITOR) 20 MG tablet Take by mouth. 06/22/17   [provider]  calcium citrate-vitamin D (CITRACAL+D) 315-200 MG-UNIT tablet Take by mouth.    [provider]  carvedilol (COREG) 25 MG tablet  08/18/18   [provider]  doxycycline (VIBRA-TABS) 100 MG tablet Take 1 tablet (100 mg total) by mouth 2 (two) times daily. 11/19/23   Nadara Mustard, MD  erythromycin ophthalmic ointment SMARTSIG:Sparingly In Eye(s) 4 Times Daily 02/25/21   [provider]  fenofibrate 160 MG tablet  10/22/18   [provider]  fluticasone (FLONASE) 50 MCG/ACT nasal spray Place 2 sprays into both nostrils daily for 7 doses. 01/21/21 01/28/21  Farrel Gordon, PA-C  hydrALAZINE (APRESOLINE) 25 MG tablet Take 1 tablet (25 mg total) by mouth 2 (two) times daily as needed. 01/21/21 02/20/21  Farrel Gordon, PA-C   HYDROcodone-acetaminophen (NORCO/VICODIN) 5-325 MG tablet Take 1 tablet by mouth every 6 (six) hours as needed. 11/24/23   Nadara Mustard, MD  MAG-G 500 (27 Mg) MG TABS SMARTSIG:1 Tablet(s) By Mouth 01/22/21   [provider]  metFORMIN (GLUCOPHAGE) 500 MG tablet  01/27/19   [provider]  moxifloxacin (VIGAMOX) 0.5 % ophthalmic solution Apply 1 drop to eye 4 (four) times daily. 02/05/21   [provider]  oxybutynin (DITROPAN-XL) 10 MG 24 hr tablet Take 10 mg by mouth at bedtime.    [provider]  pantoprazole (PROTONIX) 40 MG tablet Take 40 mg by mouth daily. 01/22/21   [provider]  pioglitazone (ACTOS) 30 MG tablet TAKE 1 TABLET BY MOUTH DAILY 07/13/17   [provider]  potassium chloride (KLOR-CON) 10 MEQ tablet Take 2 tablets (20 mEq total) by mouth daily. 03/20/21   Fenton, Clint R, PA  valACYclovir (VALTREX) 1000 MG tablet Take 1,000 mg by mouth 3 (three) times daily. 02/22/21   [provider]  valsartan-hydrochlorothiazide (DIOVAN-HCT) 320-25 MG tablet TK 1 T PO D 10/22/18   [provider]      Allergies    Patient has no known allergies.    Review of Systems   Review of Systems  Neurological:  Positive for speech difficulty and weakness.  All other systems reviewed and are negative.   Physical Exam Updated Vital Signs BP (!) 186/106   Pulse 83  Resp (!) 35   Wt 84.8 kg   SpO2 100%   BMI 32.09 kg/m  Physical Exam Vitals and nursing note reviewed.  Constitutional:      Comments: Altered and has expressive aphasia  HENT:     Head: Normocephalic.     Nose: Nose normal.     Mouth/Throat:     Mouth: Mucous membranes are moist.  Eyes:     Pupils: Pupils are equal, round, and reactive to light.     Comments: Eye deviation to the left  Cardiovascular:     Rate and Rhythm: Normal rate and regular rhythm.     Pulses: Normal pulses.     Heart sounds: Normal heart sounds.  Pulmonary:     Effort:  Pulmonary effort is normal.     Breath sounds: Normal breath sounds.  Abdominal:     General: Abdomen is flat.     Palpations: Abdomen is soft.  Musculoskeletal:        General: Normal range of motion.     Cervical back: Normal range of motion and neck supple.  Skin:    General: Skin is warm.     Capillary Refill: Capillary refill takes less than 2 seconds.  Neurological:     Comments: Patient has 3 out of 5 strength on the right arm and leg.  Patient has 4 out of 5 strength on the left arm and leg.  Patient has expressive aphasia and eye deviation to the left  Psychiatric:        Mood and Affect: Mood normal.        Behavior: Behavior normal.     ED Results / Procedures / Treatments   Labs (all labs ordered are listed, but only abnormal results are displayed) Labs Reviewed  PROTIME-INR - Abnormal; Notable for the following components:      Result Value   Prothrombin Time 17.6 (*)    INR 1.4 (*)    All other components within normal limits  CBC - Abnormal; Notable for the following components:   RBC 3.64 (*)    Hemoglobin 10.9 (*)    HCT 34.6 (*)    RDW 15.9 (*)    All other components within normal limits  DIFFERENTIAL - Abnormal; Notable for the following components:   Lymphs Abs 0.6 (*)    All other components within normal limits  COMPREHENSIVE METABOLIC PANEL - Abnormal; Notable for the following components:   Potassium 3.4 (*)    Glucose, Bld 217 (*)    All other components within normal limits  I-STAT CHEM 8, ED - Abnormal; Notable for the following components:   Potassium 3.3 (*)    Glucose, Bld 220 (*)    Calcium, Ion 1.10 (*)    Hemoglobin 11.9 (*)    HCT 35.0 (*)    All other components within normal limits  CBG MONITORING, ED - Abnormal; Notable for the following components:   Glucose-Capillary 214 (*)    All other components within normal limits  RESP PANEL BY RT-PCR (RSV, FLU A&B, COVID)  RVPGX2  APTT  ETHANOL  CK    EKG EKG  Interpretation Date/Time:  Tuesday February 23 2024 15:34:01 EDT Ventricular Rate:  93 PR Interval:    QRS Duration:  84 QT Interval:  393 QTC Calculation: 468 R Axis:   27  Text Interpretation: Atrial fibrillation Ventricular premature complex No significant change since last tracing Confirmed by Richardean Canal [phone]) on 02/23/2024 3:46:10 PM  Radiology CT  HEAD CODE STROKE WO CONTRAST Result Date: 02/23/2024 CLINICAL DATA:  Code stroke.  Right-sided deficits. EXAM: CT HEAD WITHOUT CONTRAST TECHNIQUE: Contiguous axial images were obtained from the base of the skull through the vertex without intravenous contrast. RADIATION DOSE REDUCTION: This exam was performed according to the departmental dose-optimization program which includes automated exposure control, adjustment of the mA and/or kV according to patient size and/or use of iterative reconstruction technique. COMPARISON:  CTA head and neck 06/04/2023 FINDINGS: Brain: There is evidence of an acute left MCA infarct involving the insula and portions of the posterior frontal, anterior parietal, and superior temporal lobes primarily at the level of the operculum. No acute intracranial hemorrhage, midline shift, or extra-axial fluid collection is identified. There is a moderate-sized chronic infarct inferiorly in the right cerebellar hemisphere. Cerebral volume is within normal limits for age. The ventricles are normal in size. Vascular: Calcified atherosclerosis at the skull base. Hyperdense appearance of the left MCA bifurcation, to be more fully evaluated on pending CTA. Skull: No acute fracture or suspicious lesion. Sinuses/Orbits: Mucous retention cyst laterally in the left sphenoid sinus. Clear mastoid air cells. Bilateral cataract extraction. Other: None. ASPECTS [facility] Stroke Program Early CT Score) - Ganglionic level infarction (caudate, lentiform nuclei, internal capsule, insula, M1-M3 cortex): 5 - Supraganglionic infarction (M4-M6 cortex): 2  Total score (0-10 with 10 being normal): 7 These results were called by telephone at the time of interpretation on 02/23/2024 at 3:36 pm to Dr. Caryl Pina, who verbally acknowledged these results. IMPRESSION: 1. Acute left MCA infarct. ASPECTS of 7. 2. No acute intracranial hemorrhage. 3. Chronic right cerebellar infarct. Electronically Signed   By: Sebastian Ache M.D.   On: 02/23/2024 15:45    Procedures Procedures    CRITICAL CARE Performed by: Richardean Canal   Total critical care time: 45 minutes  Critical care time was exclusive of separately billable procedures and treating other patients.  Critical care was necessary to treat or prevent imminent or life-threatening deterioration.  Critical care was time spent personally by me on the following activities: development of treatment plan with patient and/or surrogate as well as nursing, discussions with consultants, evaluation of patient's response to treatment, examination of patient, obtaining history from patient or surrogate, ordering and performing treatments and interventions, ordering and review of laboratory studies, ordering and review of radiographic studies, pulse oximetry and re-evaluation of patient's condition.   Medications Ordered in ED Medications - No data to display  ED Course/ Medical Decision Making/ A&P                                 Medical Decision Making Shamone Winzer is a 83 y.o. female history of previous stroke with right-sided deficit and now with slurred speech and left eye deviation.  Concern for stroke versus bleed.  Code stroke activated by EMS.  Patient is still protecting her airway currently and following commands.  Family is at bedside and neurology at bedside.  Patient CT head did not show a bleed but patient CTA showed acute left MCA stroke.  Patient will be taken emergently to IR for catheter guided tPA.  Patient will be admitted by neurology ICU   Problems Addressed: Cerebrovascular accident  (CVA), unspecified mechanism (HCC): acute illness or injury  Amount and/or Complexity of Data Reviewed Labs: ordered. Decision-making details documented in ED Course. Radiology: ordered and independent interpretation performed. Decision-making details documented in ED Course.  Risk  Decision regarding hospitalization.    Final Clinical Impression(s) / ED Diagnoses Final diagnoses:  Cerebrovascular accident (CVA), unspecified mechanism [facility])    Rx / DC Orders ED Discharge Orders     None         Charlynne Pander, MD 02/23/24 [phone]

## 2024-02-23 NOTE — Procedures (Signed)
 INR  Status post left common carotid arteriogram  Right CFA approach.  Findings.  Occluded dominant inferior division  of the left middle cerebral artery proximally.  Status post complete revascularization of the occluded dominant inferior division with 1 pass with an 062 contact  aspiration achieving a TICI 3 revascularization.  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  Post CT brain demonstrates contrast staining in the infarcted peri insular cortical and subcortical area extending into the anterior temporal lobe as seen on the CT of the brain.  Mild hyperattenuation also noted in the peri insular subarachnoid space.  8 French Angio-Seal closure device deployed oat the right groin puncture site.  Distal pulses all present.  Patient left intubated due to her preprocedural neurological status,.  Fatima Sanger MD.

## 2024-02-23 NOTE — Transfer of Care (Signed)
 Immediate Anesthesia Transfer of Care Note  Patient: [facility]  Procedure(s) Performed: RADIOLOGY WITH ANESTHESIA  Patient Location: ICU  Anesthesia Type:General  Level of Consciousness: sedated, responds to stimulation, and Patient remains intubated per anesthesia plan  Airway & Oxygen Therapy: Patient remains intubated per anesthesia plan and Patient placed on Ventilator (see vital sign flow sheet for setting)  Post-op Assessment: Report given to RN and Post -op Vital signs reviewed and stable  Post vital signs: Reviewed and stable  Last Vitals:  Vitals Value Taken Time  BP 145/76   Temp 98.6   Pulse 93 02/23/24 1718  Resp 20 02/23/24 1718  SpO2 100 % 02/23/24 1718  Vitals shown include unfiled device data.  Last Pain: There were no vitals filed for this visit.       Complications: No notable events documented.

## 2024-02-23 NOTE — Anesthesia Procedure Notes (Signed)
 Procedure Name: Intubation Date/Time: 02/23/2024 3:59 PM  Performed by: Cy Blamer, CRNAPre-anesthesia Checklist: Patient identified, Emergency Drugs available, Suction available and Patient being monitored Patient Re-evaluated:Patient Re-evaluated prior to induction Oxygen Delivery Method: Circle system utilized Preoxygenation: Pre-oxygenation with 100% oxygen Induction Type: IV induction and Rapid sequence Laryngoscope Size: Glidescope and 3 Grade View: Grade I Tube type: Oral Tube size: 7.0 mm Number of attempts: 1 Airway Equipment and Method: Video-laryngoscopy and Rigid stylet Placement Confirmation: ETT inserted through vocal cords under direct vision, positive ETCO2 and breath sounds checked- equal and bilateral Secured at: 21 cm Tube secured with: Tape Dental Injury: Teeth and Oropharynx as per pre-operative assessment  Difficulty Due To: Difficult Airway- due to cervical collar Comments: RSI for NPO status

## 2024-02-23 NOTE — Anesthesia Procedure Notes (Signed)
 Arterial Line Insertion Start/End3/18/2025 4:03 PM, 02/23/2024 4:07 PM Performed by: Darryl Nestle, CRNA, CRNA  Patient location: OOR procedure area. Preanesthetic checklist: patient identified, IV checked, site marked, risks and benefits discussed, surgical consent, monitors and equipment checked, pre-op evaluation, timeout performed and anesthesia consent Emergency situation Lidocaine 1% used for infiltration and patient sedated Left, radial was placed Catheter size: 20 G Hand hygiene performed  and maximum sterile barriers used  Allen's test indicative of satisfactory collateral circulation Attempts: 2 Procedure performed using ultrasound guided technique. Ultrasound Notes:anatomy identified, needle tip was noted to be adjacent to the nerve/plexus identified and no ultrasound evidence of intravascular and/or intraneural injection Following insertion, dressing applied. Post procedure assessment: normal and unchanged  Patient tolerated the procedure well with no immediate complications.

## 2024-02-23 NOTE — ED Triage Notes (Signed)
 Pt LKW at 0330. Pt found on ground at 1330 by family unk down time. Pt with garbled speech, right sided droop. Left gaze and left sided deficits. Per family pt has hx of stroke with mild right sided deficits. Pt was able to complete ADLs with mild deficits. Pt arrives with EMS and is unable to answer questions. Garbled speeck and unable to purposefully move right side.

## 2024-02-23 NOTE — Progress Notes (Signed)
 Patient's SBP slightly less than goal (115-119 range) - spoke with Neuro MD on call [place]. Verbal order to maintain SBP above 110, and if less than that initiate pressor support.   Care ongoing.  Hughie Closs, RN

## 2024-02-23 NOTE — Sedation Documentation (Signed)
 Patient moved to table by staff , secured, hooked to monitors, and now under the care of anesthesia. Please see charting in vitals per CRNA.

## 2024-02-23 NOTE — Consult Note (Signed)
 NAME:  Stephanie Pace, MRN:  [id number], DOB:  [birth year], LOS: 0 ADMISSION DATE:  02/23/2024, CONSULTATION DATE:  02/23/24 REFERRING MD:  Dr. Corliss Skains, CHIEF COMPLAINT:  AMS    History of Present Illness:  83 yo F PMH Afib -- Rx eliquis but reportedly is not taking, "mini stroke" w some residual R sided weakness was found down by daughter 02/23/24 1355 prompting ED presentation. LKN 0330. Had R sided weakness, L gaze deviation. In ED found to have L MCA territory stroke, with LVO Was intubated and went for thrombectomy with NIR Post procedural CT with some contrast staining and small SAH   Remains intubated after the case PCCM consulted in this setting   Pertinent  Medical History  Afib HTN "Mini stroke" w/ possible mild residual deficits, independent in ADLs CKD 2  DM2  HLD pHTN RV dysfunction  Significant Hospital Events: Including procedures, antibiotic start and stop dates in addition to other pertinent events   3/18 Admitted, NIR  Interim History / Subjective:   Objective   Blood pressure (!) 186/106, pulse 83, resp. rate (!) 35, height 5\' 4"  (1.626 m), weight 84.8 kg, SpO2 100%.       No intake or output data in the 24 hours ending 02/23/24 1716 Filed Weights   02/23/24 1513 02/23/24 1632  Weight: 84.8 kg 84.8 kg   Examination: Propofol 20 General:  Critically ill obese elderly female sedated on MV in NAD HEENT: MM pink/moist, edentulous, ETT, pupils 3/r, anicteric Neuro: sedated, seems to move LUE purposefully, not f/c, withdrawals RLE> LLE, flaccid RUE CV: irir, afib, no murmur, R femoral site with bulky dressing, +dp, L radial aline PULM:  MV supported, diffuse wheeze, +coughing, clear secretions GI: obese, +bs, NT, foley- cyu Extremities: warm/dry, no pitting tibial edema edema  Skin: no rashes  Resolved Hospital Problem list     Assessment & Plan:   Acute L MCA CVA s/p NIR mechanical thrombectomy w/TICI 3 revascularization  - hx of mild right sided  deficits w/previous CVA but able independent in ADLs P:  - Per Neuro and NIR - SBP goal 120-140 - MRI brain tonight at midnight - trend neuro exams/ neurovascular checks - further stroke workup per Neuro - PT/ OT/ SLP when appropriate    Acute respiratory insufficiency related to above Tobacco abuse - full MV support, 4-8cc/kg IBW with goal Pplat <30 and DP<15  - VAP prevention protocol/ PPI - PAD protocol for sedation> propofol/ prn fentanyl for RASS goal 0/-1 w/ bowel regimen - CXR/ ABG - wean FiO2 as able for SpO2 >92% - daily SAT & SBT when appropriate, keep intubated overnight - triple nebs, consider steroids if still wheezing - cessation counseling when appropriate, nicotine patch prn    HTN HLD - hold - cleviprex for SBP 120-140 - prn hydralazine - echo - will need to verify home meds - lipid panel   Afib  - supposed to be on Eliquis, but apparently not taking - currently rate controlled - optimize electrolytes   Hypokalemia - replete prn, goal > 4, check mag   DM - CBG w/ prn SSI - A1c 6.1  Normocytic anemia - trend on CBC   Best Practice (right click and "Reselect all SmartList Selections" daily)   Diet/type: NPO DVT prophylaxis SCD Pressure ulcer(s): N/A GI prophylaxis: PPI Lines: Arterial Line Foley:  Yes, and it is still needed Code Status:  full code Last date of multidisciplinary goals of care discussion [per primary team]  Labs  CBC: Recent Labs  Lab 02/23/24 1509 02/23/24 1514  WBC 8.2  --   NEUTROABS 6.9  --   HGB 10.9* 11.9*  HCT 34.6* 35.0*  MCV 95.1  --   PLT 268  --     Basic Metabolic Panel: Recent Labs  Lab 02/23/24 1509 02/23/24 1514  NA 137 136  K 3.4* 3.3*  CL 101 99  CO2 25  --   GLUCOSE 217* 220*  BUN 15 15  CREATININE 0.78 0.70  CALCIUM 8.9  --    GFR: Estimated Creatinine Clearance: 57.1 mL/min (by C-G formula based on SCr of 0.7 mg/dL). Recent Labs  Lab 02/23/24 1509  WBC 8.2    Liver  Function Tests: Recent Labs  Lab 02/23/24 1509  AST 20  ALT 14  ALKPHOS 61  BILITOT 1.0  PROT 7.1  ALBUMIN 3.7   No results for input(s): "LIPASE", "AMYLASE" in the last 168 hours. No results for input(s): "AMMONIA" in the last 168 hours.  ABG    Component Value Date/Time   TCO2 25 02/23/2024 1514     Coagulation Profile: Recent Labs  Lab 02/23/24 1509  INR 1.4*    Cardiac Enzymes: No results for input(s): "CKTOTAL", "CKMB", "CKMBINDEX", "TROPONINI" in the last 168 hours.  HbA1C: No results found for: "HGBA1C"  CBG: Recent Labs  Lab 02/23/24 1509  GLUCAP 214*    Review of Systems:   unable  Past Medical History:  She,  has a past medical history of Diabetes mellitus without complication (HCC) and Hypertension.   Surgical History:  No past surgical history on file.   Social History:   reports that she has been smoking cigarettes. She has never used smokeless tobacco. She reports current alcohol use of about 1.0 standard drink of alcohol per week. She reports that she does not use drugs.   Family History:  Her family history is not on file.   Allergies No Known Allergies   Home Medications  Prior to Admission medications   Medication Sig Start Date End Date Taking? Authorizing Provider  acetaminophen (TYLENOL) 500 MG tablet Take 500 mg by mouth every 6 (six) hours as needed for mild pain or moderate pain.   Yes [provider]  amLODipine (NORVASC) 5 MG tablet Take 5 mg by mouth daily. 12/26/22  Yes [provider]  aspirin EC 81 MG tablet Take 162 mg by mouth daily.   Yes [provider]  atorvastatin (LIPITOR) 20 MG tablet Take by mouth. 06/22/17  Yes [provider]  Calcium Carbonate (CALCIUM 600 PO) Take 1 tablet by mouth daily.   Yes [provider]  Cholecalciferol (VITAMIN D3) 50 MCG (2000 UT) capsule Take 2,000 Units by mouth daily.   Yes [provider]  cloNIDine (CATAPRES) 0.1 MG tablet  Take 0.1 mg by mouth 2 (two) times daily.   Yes [provider]  fenofibrate 160 MG tablet Take 160 mg by mouth daily. 10/22/18  Yes [provider]  ferrous sulfate 325 (65 FE) MG EC tablet Take 325 mg by mouth daily with breakfast.   Yes [provider]  gabapentin (NEURONTIN) 300 MG capsule Take 300 mg by mouth 2 (two) times daily. 02/12/23  Yes [provider]  metFORMIN (GLUCOPHAGE) 500 MG tablet Take 500 mg by mouth 2 (two) times daily with a meal. 01/27/19  Yes [provider]  Multiple Vitamins-Minerals (CENTRUM MINIS WOMEN 50+) TABS Take 1 tablet by mouth daily.   Yes [provider]  Multiple Vitamins-Minerals (ZINC PO) Take 1 tablet by mouth daily.   Yes [provider]  pantoprazole (PROTONIX) 40 MG tablet Take 40 mg by mouth daily. 01/22/21  Yes [provider]  pioglitazone (ACTOS) 30 MG tablet TAKE 1 TABLET BY MOUTH DAILY 07/13/17  Yes [provider]  potassium chloride SA (KLOR-CON M) 20 MEQ tablet Take 1 tablet by mouth daily. 11/06/21  Yes [provider]  spironolactone (ALDACTONE) 100 MG tablet Take 1 tablet by mouth daily. 01/13/23  Yes [provider]  tiZANidine (ZANAFLEX) 4 MG tablet Take 4 mg by mouth every 8 (eight) hours as needed for muscle spasms. 09/15/22  Yes [provider]  apixaban (ELIQUIS) 5 MG TABS tablet Take 1 tablet (5 mg total) by mouth 2 (two) times daily. Patient not taking: Reported on 02/23/2024 03/29/21 04/28/21  Fenton, Levonne Spiller R, PA  HYDROcodone-acetaminophen (NORCO/VICODIN) 5-325 MG tablet Take 1 tablet by mouth every 6 (six) hours as needed. Patient not taking: Reported on 02/23/2024 11/24/23   Nadara Mustard, MD     Critical care time: 40 mins        Posey Boyer, MSN, AG-ACNP-BC [facility] 02/23/2024, 5:57 PM  See Amion for pager If no response to pager , please call [phone] until 7pm After 7:00 pm call Elink   [phone]

## 2024-02-24 ENCOUNTER — Inpatient Hospital Stay (HOSPITAL_COMMUNITY)

## 2024-02-24 ENCOUNTER — Encounter (HOSPITAL_COMMUNITY): Payer: Self-pay | Admitting: Radiology

## 2024-02-24 DIAGNOSIS — J9589 Other postprocedural complications and disorders of respiratory system, not elsewhere classified: Secondary | ICD-10-CM | POA: Diagnosis not present

## 2024-02-24 DIAGNOSIS — I6389 Other cerebral infarction: Secondary | ICD-10-CM | POA: Diagnosis not present

## 2024-02-24 DIAGNOSIS — I639 Cerebral infarction, unspecified: Secondary | ICD-10-CM | POA: Diagnosis not present

## 2024-02-24 DIAGNOSIS — I10 Essential (primary) hypertension: Secondary | ICD-10-CM | POA: Diagnosis not present

## 2024-02-24 DIAGNOSIS — E785 Hyperlipidemia, unspecified: Secondary | ICD-10-CM | POA: Diagnosis not present

## 2024-02-24 LAB — BASIC METABOLIC PANEL
Anion gap: 11 (ref 5–15)
BUN: 13 mg/dL (ref 8–23)
CO2: 24 mmol/L (ref 22–32)
Calcium: 8.1 mg/dL — ABNORMAL LOW (ref 8.9–10.3)
Chloride: 103 mmol/L (ref 98–111)
Creatinine, Ser: 0.79 mg/dL (ref 0.44–1.00)
GFR, Estimated: >60 mL/min (ref >60)
Glucose, Bld: 88 mg/dL (ref 70–99)
Potassium: 4.5 mmol/L (ref 3.5–5.1)
Sodium: 138 mmol/L (ref 135–145)

## 2024-02-24 LAB — CBC
HCT: 31.5 % — ABNORMAL LOW (ref 36.0–46.0)
Hemoglobin: 9.8 g/dL — ABNORMAL LOW (ref 12.0–15.0)
MCH: 29.8 pg (ref 26.0–34.0)
MCHC: 31.1 g/dL (ref 30.0–36.0)
MCV: 95.7 fL (ref 80.0–100.0)
Platelets: 252 10*3/uL (ref 150–400)
RBC: 3.29 MIL/uL — ABNORMAL LOW (ref 3.87–5.11)
RDW: 16.3 % — ABNORMAL HIGH (ref 11.5–15.5)
WBC: 11.2 10*3/uL — ABNORMAL HIGH (ref 4.0–10.5)
nRBC: 0 % (ref 0.0–0.2)

## 2024-02-24 LAB — LIPID PANEL
Cholesterol: 112 mg/dL (ref 0–200)
HDL: 25 mg/dL — ABNORMAL LOW (ref >40)
LDL Cholesterol: 63 mg/dL (ref 0–99)
Total CHOL/HDL Ratio: 4.5 ratio
Triglycerides: 121 mg/dL (ref <150)
VLDL: 24 mg/dL (ref 0–40)

## 2024-02-24 LAB — CBC WITH DIFFERENTIAL/PLATELET
Abs Immature Granulocytes: 0.03 10*3/uL (ref 0.00–0.07)
Basophils Absolute: 0 10*3/uL (ref 0.0–0.1)
Basophils Relative: 1 %
Eosinophils Absolute: 0.3 10*3/uL (ref 0.0–0.5)
Eosinophils Relative: 3 %
HCT: 29.5 % — ABNORMAL LOW (ref 36.0–46.0)
Hemoglobin: 9.1 g/dL — ABNORMAL LOW (ref 12.0–15.0)
Immature Granulocytes: 0 %
Lymphocytes Relative: 9 %
Lymphs Abs: 0.7 10*3/uL (ref 0.7–4.0)
MCH: 29.7 pg (ref 26.0–34.0)
MCHC: 30.8 g/dL (ref 30.0–36.0)
MCV: 96.4 fL (ref 80.0–100.0)
Monocytes Absolute: 0.9 10*3/uL (ref 0.1–1.0)
Monocytes Relative: 11 %
Neutro Abs: 6.1 10*3/uL (ref 1.7–7.7)
Neutrophils Relative %: 76 %
Platelets: 255 10*3/uL (ref 150–400)
RBC: 3.06 MIL/uL — ABNORMAL LOW (ref 3.87–5.11)
RDW: 16.1 % — ABNORMAL HIGH (ref 11.5–15.5)
WBC: 8.1 10*3/uL (ref 4.0–10.5)
nRBC: 0 % (ref 0.0–0.2)

## 2024-02-24 LAB — ECHOCARDIOGRAM COMPLETE
AR max vel: 2.14 cm2
AV Peak grad: 6.6 mmHg
Ao pk vel: 1.28 m/s
Area-P 1/2: 5.02 cm2
Height: 64 in
MV M vel: 2.9 m/s
MV Peak grad: 33.6 mmHg
S' Lateral: 2.3 cm
Weight: 2991.2 [oz_av]

## 2024-02-24 LAB — GLUCOSE, CAPILLARY
Glucose-Capillary: 81 mg/dL (ref 70–99)
Glucose-Capillary: 95 mg/dL (ref 70–99)
Glucose-Capillary: 123 mg/dL — ABNORMAL HIGH (ref 70–99)
Glucose-Capillary: 141 mg/dL — ABNORMAL HIGH (ref 70–99)
Glucose-Capillary: 159 mg/dL — ABNORMAL HIGH (ref 70–99)
Glucose-Capillary: 167 mg/dL — ABNORMAL HIGH (ref 70–99)

## 2024-02-24 LAB — TRIGLYCERIDES: Triglycerides: 116 mg/dL (ref <150)

## 2024-02-24 MED ORDER — ASPIRIN 81 MG PO CHEW
81.0000 mg | CHEWABLE_TABLET | Freq: Every day | ORAL | Status: DC
  Administered 2024-02-24 – 2024-02-25 (×2): 81 mg via ORAL
  Filled 2024-02-24 (×2): qty 1

## 2024-02-24 MED ORDER — MAGNESIUM SULFATE 4 GM/100ML IV SOLN
4.0000 g | Freq: Once | INTRAVENOUS | Status: AC
  Administered 2024-02-24: 4 g via INTRAVENOUS
  Filled 2024-02-24 (×2): qty 100

## 2024-02-24 MED ORDER — AMLODIPINE BESYLATE 5 MG PO TABS
5.0000 mg | ORAL_TABLET | Freq: Every day | ORAL | Status: DC
  Administered 2024-02-24 – 2024-02-26 (×3): 5 mg
  Filled 2024-02-24 (×3): qty 1

## 2024-02-24 MED ORDER — CLONIDINE HCL 0.1 MG PO TABS
0.1000 mg | ORAL_TABLET | Freq: Three times a day (TID) | ORAL | Status: DC
Start: 2024-02-24 — End: 2024-02-26
  Administered 2024-02-24 – 2024-02-26 (×7): 0.1 mg
  Filled 2024-02-24 (×7): qty 1

## 2024-02-24 MED ORDER — DEXMEDETOMIDINE HCL IN NACL 400 MCG/100ML IV SOLN
0.0000 ug/kg/h | INTRAVENOUS | Status: DC
  Administered 2024-02-24 (×2): 0.4 ug/kg/h via INTRAVENOUS
  Filled 2024-02-24 (×2): qty 100

## 2024-02-24 MED ORDER — INSULIN ASPART 100 UNIT/ML IJ SOLN
0.0000 [IU] | INTRAMUSCULAR | Status: DC
  Administered 2024-02-24: 2 [IU] via SUBCUTANEOUS
  Administered 2024-02-24: 1 [IU] via SUBCUTANEOUS
  Administered 2024-02-24: 2 [IU] via SUBCUTANEOUS
  Administered 2024-02-24 – 2024-02-25 (×2): 1 [IU] via SUBCUTANEOUS
  Administered 2024-02-25 (×3): 2 [IU] via SUBCUTANEOUS
  Administered 2024-02-26: 1 [IU] via SUBCUTANEOUS
  Administered 2024-02-26: 2 [IU] via SUBCUTANEOUS
  Administered 2024-02-26: 5 [IU] via SUBCUTANEOUS
  Administered 2024-02-26: 1 [IU] via SUBCUTANEOUS

## 2024-02-24 MED ORDER — ASPIRIN 300 MG RE SUPP: 300.0000 mg | Freq: Every day | RECTAL | Status: DC

## 2024-02-24 MED ORDER — ATORVASTATIN CALCIUM 10 MG PO TABS
20.0000 mg | ORAL_TABLET | Freq: Every day | ORAL | Status: DC
  Administered 2024-02-25 – 2024-02-26 (×2): 20 mg
  Filled 2024-02-24 (×2): qty 2

## 2024-02-24 NOTE — Progress Notes (Addendum)
 STROKE TEAM PROGRESS NOTE    SIGNIFICANT HOSPITAL EVENTS  3/18: Presented as a code stroke due to garbled speech, right sided deficits.   NIH 22 on admission CTA shows left MCA core infarct Patient taken for emergent mechanical thrombectomy w/ TICI 3 3/19: MRI shows left MCA distribution infarct   INTERIM HISTORY/SUBJECTIVE  No family at bedside.  No acute events overnight.  Patient was left intubated postprocedure.  On exam this morning, patient is intubated and sedated.  Exam done with sedation paused she does not follow commands or open her eyes.  She has withdrawal movement on the her left side.  No movement on her right arm, slight withdrawal right leg..   OBJECTIVE  CBC    Component Value Date/Time   WBC 8.1 02/24/2024 0511   RBC 3.06 (L) 02/24/2024 0511   HGB 9.1 (L) 02/24/2024 0511   HCT 29.5 (L) 02/24/2024 0511   PLT 255 02/24/2024 0511   MCV 96.4 02/24/2024 0511   MCH 29.7 02/24/2024 0511   MCHC 30.8 02/24/2024 0511   RDW 16.1 (H) 02/24/2024 0511   LYMPHSABS 0.7 02/24/2024 0511   MONOABS 0.9 02/24/2024 0511   EOSABS 0.3 02/24/2024 0511   BASOSABS 0.0 02/24/2024 0511    BMET    Component Value Date/Time   NA 138 02/24/2024 0511   K 4.5 02/24/2024 0511   CL 103 02/24/2024 0511   CO2 24 02/24/2024 0511   GLUCOSE 88 02/24/2024 0511   BUN 13 02/24/2024 0511   CREATININE 0.79 02/24/2024 0511   CALCIUM 8.1 (L) 02/24/2024 0511   GFRNONAA >60 02/24/2024 0511    IMAGING past 24 hours MR BRAIN WO CONTRAST Result Date: 02/24/2024 CLINICAL DATA:  Follow-up examination for stroke. EXAM: MRI HEAD WITHOUT CONTRAST TECHNIQUE: Multiplanar, multiecho pulse sequences of the brain and surrounding structures were obtained without intravenous contrast. COMPARISON:  CTs from 02/23/2024 FINDINGS: Brain: Cerebral volume within normal limits. Patchy T2/FLAIR hyperintensity involving the periventricular deep white matter both cerebral hemispheres, consistent with chronic small  vessel ischemic disease, mild for age. Chronic right cerebellar infarct with associated chronic hemosiderin staining. Confluent restricted diffusion involving the left insula and overlying left frontal lobe, consistent with an evolving acute left MCA distribution infarct (series 5, image 79). Area of infarction measures up to 5.5 cm in greatest dimension. Associated petechial blood products without frank hemorrhagic transformation. No significant regional mass effect. No other evidence for acute or subacute ischemia. Gray-white matter differentiation otherwise maintained. No other acute or significant chronic intracranial blood products. No mass lesion, midline shift or mass effect. No hydrocephalus or extra-axial fluid collection. Pituitary gland within normal limits. Vascular: Major intracranial vascular flow voids are maintained. Skull and upper cervical spine: Craniocervical junction within normal limits. Bone marrow signal intensity normal. No scalp soft tissue abnormality. Sinuses/Orbits: Prior bilateral ocular lens replacement. Scattered mucosal thickening present about the sphenoid ethmoidal sinuses. Trace left mastoid effusion, of doubtful significance. Patient appears to be intubated. Other: None. IMPRESSION: 1. Evolving acute left MCA distribution infarct involving the left insula and overlying left frontal lobe. Associated petechial blood products without frank hemorrhagic transformation or significant regional mass effect. 2. Underlying mild chronic microvascular ischemic disease with chronic right cerebellar infarct. Electronically Signed   By: Rise Mu M.D.   On: 02/24/2024 03:10   DG Abd Portable 1V Result Date: 02/23/2024 CLINICAL DATA:  OG tube placement EXAM: PORTABLE ABDOMEN - 1 VIEW COMPARISON:  None Available. FINDINGS: OG tube tip is in the stomach.  Nonobstructive  bowel gas pattern. IMPRESSION: OG tube in the stomach. Electronically Signed   By: Charlett Nose M.D.   On:  02/23/2024 21:42   DG CHEST PORT 1 VIEW Result Date: 02/23/2024 CLINICAL DATA:  Intubation EXAM: PORTABLE CHEST 1 VIEW COMPARISON:  None Available. FINDINGS: Endotracheal tube 3 cm above the carina. NG tube in place and appears to enter the stomach. See abdominal report. Cardiomegaly, vascular congestion. Perihilar opacities likely reflect early edema. No effusions or acute bony abnormality. IMPRESSION: Endotracheal tube 3 cm above the carina. Cardiomegaly with vascular congestion and probable perihilar edema. Electronically Signed   By: Charlett Nose M.D.   On: 02/23/2024 21:42   CT C-SPINE NO CHARGE Result Date: 02/23/2024 CLINICAL DATA:  Neuro deficit, acute, stroke suspected. Found on ground. EXAM: CT CERVICAL SPINE WITH CONTRAST TECHNIQUE: Multiplanar CT images of the cervical spine were reconstructed from contemporary CTA of the neck. RADIATION DOSE REDUCTION: This exam was performed according to the departmental dose-optimization program which includes automated exposure control, adjustment of the mA and/or kV according to patient size and/or use of iterative reconstruction technique. CONTRAST:  No additional. COMPARISON:  CTA head and neck 06/04/2023 FINDINGS: Alignment: No acute traumatic malalignment. Skull base and vertebrae: No acute fracture or suspicious lesion. Soft tissues and spinal canal: No prevertebral fluid or swelling. No visible canal hematoma. Disc levels: Moderate lower cervical disc degeneration. Asymmetrically advanced right-sided facet arthrosis at C2-3, C3-4, and C4-5. Upper chest: See today's separately reported CTA of the head and neck. IMPRESSION: No acute cervical spine fracture or traumatic malalignment. Electronically Signed   By: Sebastian Ache M.D.   On: 02/23/2024 16:32   CT ANGIO HEAD NECK W WO CM W PERF (CODE STROKE) Result Date: 02/23/2024 CLINICAL DATA:  Neuro deficit, acute, stroke suspected. Right-sided deficits. EXAM: CT ANGIOGRAPHY HEAD AND NECK CT PERFUSION BRAIN  TECHNIQUE: Multidetector CT imaging of the head and neck was performed using the standard protocol during bolus administration of intravenous contrast. Multiplanar CT image reconstructions and MIPs were obtained to evaluate the vascular anatomy. Carotid stenosis measurements (when applicable) are obtained utilizing NASCET criteria, using the distal internal carotid diameter as the denominator. Multiphase CT imaging of the brain was performed following IV bolus contrast injection. Subsequent parametric perfusion maps were calculated using RAPID software. RADIATION DOSE REDUCTION: This exam was performed according to the departmental dose-optimization program which includes automated exposure control, adjustment of the mA and/or kV according to patient size and/or use of iterative reconstruction technique. CONTRAST:  OMNIPAQUE IOHEXOL 350 MG/ML SOLN COMPARISON:  CTA head and neck 06/04/2023 FINDINGS: CTA NECK FINDINGS Aortic arch: Normal variant 4 vessel aortic arch with the left vertebral artery arising directly from the arch. Mild-to-moderate atherosclerotic calcification resulting in approximately 50% stenosis of the origin of the left subclavian artery, similar to the prior CTA. Right carotid system: Patent without evidence of a significant stenosis or dissection. Mild beading of the mid cervical ICA, stable to slightly more prominent than on the prior study. Left carotid system: Patent without evidence of a significant stenosis or dissection. Vertebral arteries: Patent without evidence of a significant stenosis or dissection. Mildly dominant left vertebral artery. Skeleton: See separately reported cervical spine CT. Other neck: No evidence of cervical lymphadenopathy or mass. Upper chest: Interlobular septal thickening, mild ground-glass opacity, and peribronchial thickening in the included lung apices. Biapical lung scarring. Review of the MIP images confirms the above findings CTA HEAD FINDINGS Anterior  circulation: The internal carotid arteries are patent from skull  base to carotid termini with unchanged mild cavernous and supraclinoid stenoses bilaterally. The ACAs and right MCA are patent with moderate branch vessel irregularity but no evidence a flow limiting proximal stenosis. The left M1 segment is widely patent, however there is a new occlusion of the M2 superior division proximally near the bifurcation with mild distal reconstitution. No aneurysm is identified. Posterior circulation: The intracranial vertebral arteries are patent to the basilar with unchanged moderate to severe right and mild left V4 stenoses. Severe bilateral SCA stenoses are again noted. The basilar artery is patent without evidence of a significant stenosis. There are large right and diminutive or absent left posterior communicating arteries with hypoplasia of the right P1 segment. Both PCAs are patent with moderate P2 stenoses bilaterally. No aneurysm is identified. Venous sinuses: Patent. Anatomic variants: Fetal right PCA. Review of the MIP images confirms the above findings CT Brain Perfusion Findings: ASPECTS: 7 CBF (<30%) Volume: 16 mL Perfusion (Tmax>6.0s) volume: 35 mL Mismatch Volume: 19 mL Infarction Location: Left MCA These results were called by telephone at the time of interpretation on 02/23/2024 at 3:36 pm to Dr. Caryl Pina, who verbally acknowledged these results. IMPRESSION: 1. Acute proximal left M2 occlusion. 2. 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 3. Unchanged intracranial atherosclerosis including mild bilateral ICA, moderate bilateral P2, and moderate to severe right V4 stenoses. 4. Widely patent cervical carotid arteries. 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 5. Suspected mild pulmonary edema in the included lung apices. 6.  Aortic Atherosclerosis (ICD10-I70.0). Electronically Signed   By: Sebastian Ache M.D.   On: 02/23/2024 16:11   CT HEAD CODE STROKE WO  CONTRAST Result Date: 02/23/2024 CLINICAL DATA:  Code stroke.  Right-sided deficits. EXAM: CT HEAD WITHOUT CONTRAST TECHNIQUE: Contiguous axial images were obtained from the base of the skull through the vertex without intravenous contrast. RADIATION DOSE REDUCTION: This exam was performed according to the departmental dose-optimization program which includes automated exposure control, adjustment of the mA and/or kV according to patient size and/or use of iterative reconstruction technique. COMPARISON:  CTA head and neck 06/04/2023 FINDINGS: Brain: There is evidence of an acute left MCA infarct involving the insula and portions of the posterior frontal, anterior parietal, and superior temporal lobes primarily at the level of the operculum. No acute intracranial hemorrhage, midline shift, or extra-axial fluid collection is identified. There is a moderate-sized chronic infarct inferiorly in the right cerebellar hemisphere. Cerebral volume is within normal limits for age. The ventricles are normal in size. Vascular: Calcified atherosclerosis at the skull base. Hyperdense appearance of the left MCA bifurcation, to be more fully evaluated on pending CTA. Skull: No acute fracture or suspicious lesion. Sinuses/Orbits: Mucous retention cyst laterally in the left sphenoid sinus. Clear mastoid air cells. Bilateral cataract extraction. Other: None. ASPECTS [facility] Stroke Program Early CT Score) - Ganglionic level infarction (caudate, lentiform nuclei, internal capsule, insula, M1-M3 cortex): 5 - Supraganglionic infarction (M4-M6 cortex): 2 Total score (0-10 with 10 being normal): 7 These results were called by telephone at the time of interpretation on 02/23/2024 at 3:36 pm to Dr. Caryl Pina, who verbally acknowledged these results. IMPRESSION: 1. Acute left MCA infarct. ASPECTS of 7. 2. No acute intracranial hemorrhage. 3. Chronic right cerebellar infarct. Electronically Signed   By: Sebastian Ache M.D.   On: 02/23/2024  15:45    Vitals:   02/24/24 0600 02/24/24 0700 02/24/24 0800 02/24/24 0817  BP: 122/89 119/69    Pulse: 75  63    Resp: (!) 22 20    Temp:   98.8 F (37.1 C)   TempSrc:   Axillary   SpO2: 93% 99%  99%  Weight:      Height:         PHYSICAL EXAM General:  Alert, well-nourished, well-developed patient in no acute distress Psych:  Mood and affect appropriate for situation CV: Regular rate and rhythm on monitor Respiratory:  Regular, unlabored respirations on room air GI: Abdomen soft and nontender   NEURO:  Patient is intubated and sedated, Propofol @ 20. She does not follow commands or open her eyes to voice or noxious stimuli. Decreased corneal reflexes bilaterally. Pupils are midline, equal, brisk reaction.  There is no gaze preference.  Motor: Withdrawal movements to left arm and left leg. Decreased withdrawal movement to right leg No movement seen to right arm.  Most Recent NIH: 47     ASSESSMENT/PLAN  Stephanie Pace is a 83 y.o. female with history of HTN, DM, A-fib not on anticoagulation who presented as a code stroke due to garbled speech and right side deficits after being found on the ground by her daughter.  She was taken for emergent mechanical thrombectomy due to a left MCA occlusion.Marland KitchenNIH on Admission: 22.  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 Associated petechial blood products without frank hemorrhagic transformation or significant regional mass effect. 2D Echo: EF 70 to 75%. There is a small mobile  density on the posterior mitral valve annulus. Measures 0.85 x 0.47 cm. Images from 03/2021 were reviewed and images are unchanged. The mitral valve is degenerative.  LDL 63 HgbA1c 6.1 VTE prophylaxis - lovenox Aspirin prior to admission, now on aspirin 81 mg daily. No DAPT at this time due to petechial hemorrhage.  Will consider AC in 5-7 days given the size of infarct and petechial hemorrhage Therapy recommendations:  Pending Disposition:  pending  Acute respiratory failure Mechanical ventilation per CCM, appreciate assistance VAP bundle SBT as tolerated Wean sedation as able, goal to extubate tomorrow  Atrial fibrillation In 03/2021 cardiology referral note, recommend to continue Eliquis 5 mg twice daily Per daughter, patient is not currently taking Eliquis.  EMS found a bottle of Eliquis from 2022, no refills documented in the system. Due to size of stroke, will start Eliquis in 5 to 7 days  Hypertension Home meds:  none Stable BP goal less than 180/105 Long-term BP goal normotensive  Hyperlipidemia Home meds: Lipitor 20 mg LDL 63, goal < 70 Resume home Lipitor Continue statin at discharge  Tobacco Abuse Patient is a current smoker      Ready to quit? N/A  Dysphagia Patient has post-stroke dysphagia, SLP consulted Currently n.p.o. On OG tube Will consider TF tomorrow if needed  Other Stroke Risk Factors Advanced age Obesity, Body mass index is 32.09 kg/m., BMI >/= 30 associated with increased stroke risk, recommend weight loss, diet and exercise as appropriate  Hx of Stroke on neuro imaging, chronic right cerebellar infarct seem on MRI   Other  Active Problems Anemia, Hgb 9.1--9.8 Mild leukocytosis, WBC 8.1--11.2  Hospital day # 1   Pt seen by Neuro NP/APP and later by MD. Note/plan to be edited by MD as needed.    Lynnae January, DNP, AGACNP-BC [facility] Please use AMION for contact information & EPIC for messaging.  ATTENDING NOTE: I  reviewed above note and agree with the assessment and plan. Pt was seen and examined.   No family at bedside.  Patient still intubated on sedation, eyes closed, not following commands. With forced eye opening, eyes in need position, not blinking to visual threat, doll's eyes absent, not tracking, PERRL. Corneal reflex weakly present on the left, gag and cough present. Breathing over the vent.  Facial symmetry not able to test due to ET tube.  Tongue protrusion not cooperative. On pain stimulation, withdrawal left upper extremity and bilateral lower extremity, left more than right, no withdrawal of right upper extremity. Sensation, coordination and gait not tested.  Patient had moderate large left MCA infarct with petechial hemorrhage, now on aspirin, etiology likely due to A-fib not on [facility], may consider AC in 5 to 7 days given signs of stroke.  CCM on board, wean off ventilation as able.  Continue home statin, will consider tube feeding tomorrow if able.  For detailed assessment and plan, please refer to above/below as I have made changes wherever appropriate.   Marvel Plan, MD PhD Stroke Neurology 02/24/2024 5:43 PM  This patient is critically ill due to large MCA infarct, status post IR, AF not on AC, respiratory failure and at significant risk of neurological worsening, death form recurrent stroke, hemorrhagic conversion, cerebral edema, heart failure, seizure. This patient's care requires constant monitoring of vital signs, hemodynamics, respiratory and cardiac monitoring, review of multiple databases, neurological assessment, discussion with family, other specialists and medical decision making of high complexity. I spent 40 minutes of neurocritical care time in the care of this patient.    To contact Stroke Continuity provider, please refer to [email or web address]. After hours, contact General Neurology

## 2024-02-24 NOTE — Anesthesia Postprocedure Evaluation (Signed)
 Anesthesia Post Note  Patient: Stephanie Pace  Procedure(s) Performed: RADIOLOGY WITH ANESTHESIA     Patient location during evaluation: ICU Anesthesia Type: General Level of consciousness: sedated Pain management: pain level controlled Vital Signs Assessment: post-procedure vital signs reviewed and stable Respiratory status: patient remains intubated per anesthesia plan Cardiovascular status: stable Postop Assessment: no apparent nausea or vomiting Anesthetic complications: no   No notable events documented.  Last Vitals:  Vitals:   02/24/24 1503 02/24/24 1600  BP:    Pulse:    Resp:    Temp:  (!) 38.5 C  SpO2: 99%     Last Pain:  Vitals:   02/24/24 1600  TempSrc: Axillary                 Rajat Staver P Tanay Misuraca

## 2024-02-24 NOTE — Progress Notes (Signed)
 SLP Cancellation Note  Patient Details Name: Stephanie Pace MRN: [id number] DOB: March 10, 1941   Cancelled treatment:       Reason Eval/Treat Not Completed: Patient not medically ready. Pt remains intubated following L carotid arteriogram. SLP will follow up for speech/language assessment post extubation as clinically indicated. Recommend order for clinical swallow evaluation.    Ellery Plunk 02/24/2024, 7:49 AM

## 2024-02-24 NOTE — Progress Notes (Signed)
 Patient transported to MRI and back to 4N18 without event.

## 2024-02-24 NOTE — Progress Notes (Signed)
 Pharmacy Electrolyte Replacement  Recent Labs:  Recent Labs    02/23/24 1725 02/23/24 1841 02/24/24 0511  K  --    < > 4.5  MG 1.6*  --   --   CREATININE  --   --  0.79   < > = values in this interval not displayed.    Low Critical Values (K </= 2.5, Phos </= 1, Mg </= 1) Present: N/A  MD Contacted: N/A  Plan:  Mg 4g IV x1   Stephenie Acres, PharmD PGY1 Pharmacy Resident 02/24/2024 1:28 PM

## 2024-02-24 NOTE — TOC Initial Note (Signed)
 Transition of Care [facility]) - Initial/Assessment Note    Patient Details  Name: Stephanie Pace MRN: [id number] Date of Birth: [birth year]  Transition of Care [facility]) CM/SW Contact:    Lamonte Sakai, Student-Social Work Phone Number: 02/24/2024, 9:10 AM  Clinical Narrative:                 Pt admitted from home due to stroke. Pt currently intubated, please place consult as TOC needs arise.        Patient Goals and CMS Choice            Expected Discharge Plan and Services       Living arrangements for the past 2 months: Single Family Home                                      Prior Living Arrangements/Services Living arrangements for the past 2 months: Single Family Home                     Activities of Daily Living      Permission Sought/Granted                  Emotional Assessment   Attitude/Demeanor/Rapport: Intubated (Following Commands or Not Following Commands) Affect (typically observed): Unable to Assess Orientation: :  (Intubated) Alcohol / Substance Use: Tobacco Use    Admission diagnosis:  Stroke (cerebrum) (HCC) [I63.9] Status post stroke [Z86.73] Cerebrovascular accident (CVA), unspecified mechanism (HCC) [I63.9] Middle cerebral artery embolism, 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] Patient Active Problem List   Diagnosis Date Noted   Status post stroke 02/23/2024   Stroke (cerebrum) (HCC) 02/23/2024   Middle cerebral artery embolism, 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   Persistent atrial fibrillation (HCC) 01/30/2021   Secondary hypercoagulable state (HCC) 01/30/2021   Hypokalemia 01/30/2015   Type 2 diabetes mellitus without complication (HCC) 08/07/2014   COPD (chronic obstructive pulmonary disease) (HCC) 07/11/2014   Renal insufficiency 07/11/2014   Sciatica 07/11/2014   Essential hypertension 04/04/2014   Generalized anxiety disorder 04/03/2014   Hyperlipidemia LDL goal <100 04/03/2014   Osteoporosis 04/03/2014   PCP:  Shellia Cleverly, PA Pharmacy:    [facility] [phone] - [place], [place] - [address] AT [facility] [address] [place] [address] Phone: [phone] Fax: [phone]  [facility] [address], [address] [place] [address] Phone: [phone] Fax: [phone]     Social Drivers of Health (SDOH) Social History: SDOH Screenings   Tobacco Use: High Risk (01/27/2024)   SDOH Interventions:     Readmission Risk Interventions     No data to display

## 2024-02-24 NOTE — Progress Notes (Signed)
 OT Cancellation Note  Patient Details Name: Stephanie Pace MRN: [id number] DOB: 1941-09-08   Cancelled Treatment:    Reason Eval/Treat Not Completed: Patient not medically ready  Mateo Flow 02/24/2024, 9:25 AM

## 2024-02-24 NOTE — Progress Notes (Signed)
 Echocardiogram 2D Echocardiogram has been performed.  Stephanie Pace 02/24/2024, 1:17 PM

## 2024-02-24 NOTE — Progress Notes (Signed)
 Referring Physician(s): Dr. Otelia Limes  Supervising Physician: Julieanne Cotton  Patient Status:  Stephanie Pace - In-pt  Chief Complaint: CODE STROKE  Subjective: Patient remains intubated after L MCA thrombectomy yesterday.  Moving all extremities spontaneously, however none to command.   Allergies: Patient has no known allergies.  Medications: Prior to Admission medications   Medication Sig Start Date End Date Taking? Authorizing Provider  acetaminophen (TYLENOL) 500 MG tablet Take 500 mg by mouth every 6 (six) hours as needed for mild pain or moderate pain.   Yes [provider]  amLODipine (NORVASC) 5 MG tablet Take 5 mg by mouth daily. 12/26/22  Yes [provider]  aspirin EC 81 MG tablet Take 162 mg by mouth daily.   Yes [provider]  atorvastatin (LIPITOR) 20 MG tablet Take by mouth. 06/22/17  Yes [provider]  Calcium Carbonate (CALCIUM 600 PO) Take 1 tablet by mouth daily.   Yes [provider]  Cholecalciferol (VITAMIN D3) 50 MCG (2000 UT) capsule Take 2,000 Units by mouth daily.   Yes [provider]  cloNIDine (CATAPRES) 0.1 MG tablet Take 0.1 mg by mouth 2 (two) times daily.   Yes [provider]  fenofibrate 160 MG tablet Take 160 mg by mouth daily. 10/22/18  Yes [provider]  ferrous sulfate 325 (65 FE) MG EC tablet Take 325 mg by mouth daily with breakfast.   Yes [provider]  gabapentin (NEURONTIN) 300 MG capsule Take 300 mg by mouth 2 (two) times daily. 02/12/23  Yes [provider]  metFORMIN (GLUCOPHAGE) 500 MG tablet Take 500 mg by mouth 2 (two) times daily with a meal. 01/27/19  Yes [provider]  Multiple Vitamins-Minerals (CENTRUM MINIS WOMEN 50+) TABS Take 1 tablet by mouth daily.   Yes [provider]  Multiple Vitamins-Minerals (ZINC PO) Take 1 tablet by mouth daily.   Yes [provider]  pantoprazole (PROTONIX) 40 MG tablet Take 40 mg  by mouth daily. 01/22/21  Yes [provider]  pioglitazone (ACTOS) 30 MG tablet TAKE 1 TABLET BY MOUTH DAILY 07/13/17  Yes [provider]  potassium chloride SA (KLOR-CON M) 20 MEQ tablet Take 1 tablet by mouth daily. 11/06/21  Yes [provider]  spironolactone (ALDACTONE) 100 MG tablet Take 1 tablet by mouth daily. 01/13/23  Yes [provider]  tiZANidine (ZANAFLEX) 4 MG tablet Take 4 mg by mouth every 8 (eight) hours as needed for muscle spasms. 09/15/22  Yes [provider]  apixaban (ELIQUIS) 5 MG TABS tablet Take 1 tablet (5 mg total) by mouth 2 (two) times daily. Patient not taking: Reported on 02/23/2024 03/29/21 04/28/21  Fenton, Levonne Spiller R, PA  HYDROcodone-acetaminophen (NORCO/VICODIN) 5-325 MG tablet Take 1 tablet by mouth every 6 (six) hours as needed. Patient not taking: Reported on 02/23/2024 11/24/23   Nadara Mustard, MD     Vital Signs: BP 119/69   Pulse 63   Temp 98.8 F (37.1 C) (Axillary)   Resp 20   Ht 5\' 4"  (1.626 m)   Wt 186 lb 15.2 oz (84.8 kg)   SpO2 97%   BMI 32.09 kg/m   Physical Exam Intubated, sedated.  Not following commands at this time.  Pupils equal, no gaze deviation.  Does not track.  Moving all extremities spontaneously  Groin site soft without issue.  No evidence of hematoma or pseudoaneurysm.  Pulses: distal pulses intact.   Imaging: MR BRAIN WO CONTRAST Result Date: 02/24/2024 CLINICAL DATA:  Follow-up examination for stroke. EXAM: MRI HEAD WITHOUT CONTRAST TECHNIQUE: Multiplanar, multiecho pulse sequences of the brain and surrounding structures were obtained without intravenous contrast. COMPARISON:  CTs from 02/23/2024 FINDINGS: Brain: Cerebral volume within normal limits. Patchy T2/FLAIR hyperintensity involving the periventricular deep white matter both cerebral hemispheres, consistent with chronic small vessel ischemic disease, mild for age. Chronic right cerebellar infarct with associated chronic  hemosiderin staining. Confluent restricted diffusion involving the left insula and overlying left frontal lobe, consistent with an evolving acute left MCA distribution infarct (series 5, image 79). Area of infarction measures up to 5.5 cm in greatest dimension. Associated petechial blood products without frank hemorrhagic transformation. No significant regional mass effect. No other evidence for acute or subacute ischemia. Gray-white matter differentiation otherwise maintained. No other acute or significant chronic intracranial blood products. No mass lesion, midline shift or mass effect. No hydrocephalus or extra-axial fluid collection. Pituitary gland within normal limits. Vascular: Major intracranial vascular flow voids are maintained. Skull and upper cervical spine: Craniocervical junction within normal limits. Bone marrow signal intensity normal. No scalp soft tissue abnormality. Sinuses/Orbits: Prior bilateral ocular lens replacement. Scattered mucosal thickening present about the sphenoid ethmoidal sinuses. Trace left mastoid effusion, of doubtful significance. Patient appears to be intubated. Other: None. IMPRESSION: 1. Evolving acute left MCA distribution infarct involving the left insula and overlying left frontal lobe. Associated petechial blood products without frank hemorrhagic transformation or significant regional mass effect. 2. Underlying mild chronic microvascular ischemic disease with chronic right cerebellar infarct. Electronically Signed   By: Rise Mu M.D.   On: 02/24/2024 03:10   DG Abd Portable 1V Result Date: 02/23/2024 CLINICAL DATA:  OG tube placement EXAM: PORTABLE ABDOMEN - 1 VIEW COMPARISON:  None Available. FINDINGS: OG tube tip is in the stomach.  Nonobstructive bowel gas pattern. IMPRESSION: OG tube in the stomach. Electronically Signed   By: Charlett Nose M.D.   On: 02/23/2024 21:42   DG CHEST PORT 1 VIEW Result Date: 02/23/2024 CLINICAL DATA:  Intubation EXAM:  PORTABLE CHEST 1 VIEW COMPARISON:  None Available. FINDINGS: Endotracheal tube 3 cm above the carina. NG tube in place and appears to enter the stomach. See abdominal report. Cardiomegaly, vascular congestion. Perihilar opacities likely reflect early edema. No effusions or acute bony abnormality. IMPRESSION: Endotracheal tube 3 cm above the carina. Cardiomegaly with vascular congestion and probable perihilar edema. Electronically Signed   By: Charlett Nose M.D.   On: 02/23/2024 21:42   CT C-SPINE NO CHARGE Result Date: 02/23/2024 CLINICAL DATA:  Neuro deficit, acute, stroke suspected. Found on ground. EXAM: CT CERVICAL SPINE WITH CONTRAST TECHNIQUE: Multiplanar CT images of the cervical spine were reconstructed from contemporary CTA of the neck. RADIATION DOSE REDUCTION: This exam was performed according to the departmental dose-optimization program which includes automated exposure control, adjustment of the mA and/or kV according to patient size and/or use of iterative reconstruction technique. CONTRAST:  No additional. COMPARISON:  CTA head and neck 06/04/2023 FINDINGS: Alignment: No acute traumatic malalignment. Skull base and vertebrae: No acute fracture or suspicious lesion. Soft tissues and spinal canal: No prevertebral fluid or swelling. No visible canal hematoma. Disc levels: Moderate lower cervical disc degeneration. Asymmetrically advanced right-sided facet arthrosis at C2-3, C3-4, and C4-5. Upper chest: See today's separately reported CTA of the head and neck. IMPRESSION: No acute cervical spine fracture or traumatic malalignment. Electronically Signed   By: Sebastian Ache M.D.   On: 02/23/2024 16:32   CT ANGIO HEAD NECK W WO CM W PERF (  CODE STROKE) Result Date: 02/23/2024 CLINICAL DATA:  Neuro deficit, acute, stroke suspected. Right-sided deficits. EXAM: CT ANGIOGRAPHY HEAD AND NECK CT PERFUSION BRAIN TECHNIQUE: Multidetector CT imaging of the head and neck was performed using the standard protocol  during bolus administration of intravenous contrast. Multiplanar CT image reconstructions and MIPs were obtained to evaluate the vascular anatomy. Carotid stenosis measurements (when applicable) are obtained utilizing NASCET criteria, using the distal internal carotid diameter as the denominator. Multiphase CT imaging of the brain was performed following IV bolus contrast injection. Subsequent parametric perfusion maps were calculated using RAPID software. RADIATION DOSE REDUCTION: This exam was performed according to the departmental dose-optimization program which includes automated exposure control, adjustment of the mA and/or kV according to patient size and/or use of iterative reconstruction technique. CONTRAST:  OMNIPAQUE IOHEXOL 350 MG/ML SOLN COMPARISON:  CTA head and neck 06/04/2023 FINDINGS: CTA NECK FINDINGS Aortic arch: Normal variant 4 vessel aortic arch with the left vertebral artery arising directly from the arch. Mild-to-moderate atherosclerotic calcification resulting in approximately 50% stenosis of the origin of the left subclavian artery, similar to the prior CTA. Right carotid system: Patent without evidence of a significant stenosis or dissection. Mild beading of the mid cervical ICA, stable to slightly more prominent than on the prior study. Left carotid system: Patent without evidence of a significant stenosis or dissection. Vertebral arteries: Patent without evidence of a significant stenosis or dissection. Mildly dominant left vertebral artery. Skeleton: See separately reported cervical spine CT. Other neck: No evidence of cervical lymphadenopathy or mass. Upper chest: Interlobular septal thickening, mild ground-glass opacity, and peribronchial thickening in the included lung apices. Biapical lung scarring. Review of the MIP images confirms the above findings CTA HEAD FINDINGS Anterior circulation: The internal carotid arteries are patent from skull base to carotid termini with  unchanged mild cavernous and supraclinoid stenoses bilaterally. The ACAs and right MCA are patent with moderate branch vessel irregularity but no evidence a flow limiting proximal stenosis. The left M1 segment is widely patent, however there is a new occlusion of the M2 superior division proximally near the bifurcation with mild distal reconstitution. No aneurysm is identified. Posterior circulation: The intracranial vertebral arteries are patent to the basilar with unchanged moderate to severe right and mild left V4 stenoses. Severe bilateral SCA stenoses are again noted. The basilar artery is patent without evidence of a significant stenosis. There are large right and diminutive or absent left posterior communicating arteries with hypoplasia of the right P1 segment. Both PCAs are patent with moderate P2 stenoses bilaterally. No aneurysm is identified. Venous sinuses: Patent. Anatomic variants: Fetal right PCA. Review of the MIP images confirms the above findings CT Brain Perfusion Findings: ASPECTS: 7 CBF (<30%) Volume: 16 mL Perfusion (Tmax>6.0s) volume: 35 mL Mismatch Volume: 19 mL Infarction Location: Left MCA These results were called by telephone at the time of interpretation on 02/23/2024 at 3:36 pm to Dr. Caryl Pina, who verbally acknowledged these results. IMPRESSION: 1. Acute proximal left M2 occlusion. 2. 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 3. Unchanged intracranial atherosclerosis including mild bilateral ICA, moderate bilateral P2, and moderate to severe right V4 stenoses. 4. Widely patent cervical carotid arteries. 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 5. Suspected mild pulmonary edema in the included lung apices. 6.  Aortic Atherosclerosis (ICD10-I70.0). Electronically Signed   By: Sebastian Ache M.D.   On: 02/23/2024 16:11   CT HEAD CODE STROKE WO CONTRAST Result Date: 02/23/2024 CLINICAL  DATA:  Code stroke.  Right-sided deficits. EXAM: CT HEAD WITHOUT  CONTRAST TECHNIQUE: Contiguous axial images were obtained from the base of the skull through the vertex without intravenous contrast. RADIATION DOSE REDUCTION: This exam was performed according to the departmental dose-optimization program which includes automated exposure control, adjustment of the mA and/or kV according to patient size and/or use of iterative reconstruction technique. COMPARISON:  CTA head and neck 06/04/2023 FINDINGS: Brain: There is evidence of an acute left MCA infarct involving the insula and portions of the posterior frontal, anterior parietal, and superior temporal lobes primarily at the level of the operculum. No acute intracranial hemorrhage, midline shift, or extra-axial fluid collection is identified. There is a moderate-sized chronic infarct inferiorly in the right cerebellar hemisphere. Cerebral volume is within normal limits for age. The ventricles are normal in size. Vascular: Calcified atherosclerosis at the skull base. Hyperdense appearance of the left MCA bifurcation, to be more fully evaluated on pending CTA. Skull: No acute fracture or suspicious lesion. Sinuses/Orbits: Mucous retention cyst laterally in the left sphenoid sinus. Clear mastoid air cells. Bilateral cataract extraction. Other: None. ASPECTS [facility] Stroke Program Early CT Score) - Ganglionic level infarction (caudate, lentiform nuclei, internal capsule, insula, M1-M3 cortex): 5 - Supraganglionic infarction (M4-M6 cortex): 2 Total score (0-10 with 10 being normal): 7 These results were called by telephone at the time of interpretation on 02/23/2024 at 3:36 pm to Dr. Caryl Pina, who verbally acknowledged these results. IMPRESSION: 1. Acute left MCA infarct. ASPECTS of 7. 2. No acute intracranial hemorrhage. 3. Chronic right cerebellar infarct. Electronically Signed   By: Sebastian Ache M.D.   On: 02/23/2024 15:45    Labs:  CBC: Recent Labs    02/23/24 1509 02/23/24 1514 02/23/24 1841 02/24/24 0511  WBC  8.2  --   --  8.1  HGB 10.9* 11.9* 11.6* 9.1*  HCT 34.6* 35.0* 34.0* 29.5*  PLT 268  --   --  255    COAGS: Recent Labs    02/23/24 1509  INR 1.4*  APTT 25    BMP: Recent Labs    02/23/24 1509 02/23/24 1514 02/23/24 1841 02/24/24 0511  NA 137 136 137 138  K 3.4* 3.3* 3.4* 4.5  CL 101 99  --  103  CO2 25  --   --  24  GLUCOSE 217* 220*  --  88  BUN 15 15  --  13  CALCIUM 8.9  --   --  8.1*  CREATININE 0.78 0.70  --  0.79  GFRNONAA >60  --   --  >60    LIVER FUNCTION TESTS: Recent Labs    02/23/24 1509  BILITOT 1.0  AST 20  ALT 14  ALKPHOS 61  PROT 7.1  ALBUMIN 3.7    Assessment and Plan: L MCA infarct s/p mechanical thrombectomy by Dr. Corliss Skains 02/23/24 Patient assessed at bedside alongside Dr. Corliss Skains.  She remains intubated and sedated on propofol.  Moving all extremities.  No gaze deviation noted.  Working with RRT.  Groin procedure site stable without issue.   Further care per Neuro.   Electronically Signed: Hoyt Koch, PA 02/24/2024, 11:36 AM   I spent a total of 15 Minutes at the the patient's bedside AND on the patient's hospital floor or unit, greater than 50% of which was counseling/coordinating care for L MCA infarct

## 2024-02-24 NOTE — Progress Notes (Signed)
 PT Cancellation Note  Patient Details Name: Marzetta Lanza MRN: [id number] DOB: 01-06-1941   Cancelled Treatment:    Reason Eval/Treat Not Completed: Patient not medically ready; per RN, but reports may be more appropriate later today.  Will continue attempts.    Elray Mcgregor 02/24/2024, 9:39 AM Sheran Lawless, PT Acute Rehabilitation Services Office:[phone] 02/24/2024

## 2024-02-24 NOTE — Progress Notes (Addendum)
 NAME:  Stephanie Pace, MRN:  [id number], DOB:  06-Feb-1941, LOS: 1 ADMISSION DATE:  02/23/2024, CONSULTATION DATE:  02/23/24 REFERRING MD:  Dr. Corliss Skains, CHIEF COMPLAINT:  AMS    History of Present Illness:  83 yo F PMH Afib -- Rx eliquis but reportedly is not taking, "mini stroke" w some residual R sided weakness was found down by daughter 02/23/24 1355 prompting ED presentation. LKN 0330. Had R sided weakness, L gaze deviation. In ED found to have L MCA territory stroke, with LVO Was intubated and went for thrombectomy with NIR Post procedural CT with some contrast staining and small SAH   Remains intubated after the case PCCM consulted in this setting   Pertinent  Medical History  Afib HTN "Mini stroke" w/ possible mild residual deficits, independent in ADLs CKD 2  DM2  HLD pHTN RV dysfunction  Significant Hospital Events: Including procedures, antibiotic start and stop dates in addition to other pertinent events   3/18 Admitted, NIR 3/19 MRI shows evolving left MCA CVA  Interim History / Subjective:   Remains critically ill, intubated Sedated on propofol Afebrile   Objective   Blood pressure 119/69, pulse 63, temperature 98.8 F (37.1 C), temperature source Axillary, resp. rate 20, height 5\' 4"  (1.626 m), weight 84.8 kg, SpO2 99%.    Vent Mode: PSV;CPAP FiO2 (%):  [40 %-60 %] 40 % Set Rate:  [18 bmp-20 bmp] 20 bmp Vt Set:  [430 mL] 430 mL PEEP:  [5 cmH20] 5 cmH20 Pressure Support:  [10 cmH20] 10 cmH20 Plateau Pressure:  [21 cmH20-25 cmH20] 23 cmH20   Intake/Output Summary (Last 24 hours) at 02/24/2024 0935 Last data filed at 02/24/2024 0600 Gross per 24 hour  Intake 2114.35 ml  Output 755 ml  Net 1359.35 ml   Filed Weights   02/23/24 1513 02/23/24 1632  Weight: 84.8 kg 84.8 kg   Examination:  General:  Critically ill obese elderly female sedated on MV in NAD HEENT: MM pink/moist, edentulous, ETT, pupils 3/r, anicteric Neuro: Sedate, RASS -2, purposeful on  left, 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 w/TICI 3 revascularization  - hx of mild right sided deficits w/previous CVA but able independent in ADLs P:  - Per Neuro and NIR - SBP goal 120-140 - trend neuro exams - further stroke workup per Neuro - PT/ OT/ SLP when appropriate    Acute respiratory insufficiency related to above Tobacco abuse -Spontaneous breathing trial with goal extubation - PAD protocol for sedation> propofol/ prn fentanyl for RASS goal 0/-1 w/ bowel regimen - wean FiO2 as able for SpO2 >92% - daily SAT & SBT when appropriate, keep intubated overnight - triple therapy via nebs - cessation counseling when appropriate, nicotine patch prn    HTN HLD - hold - cleviprex for SBP 120-140 - prn hydralazine - echo -Resume home amlodipine and clonidine and try to stay off Cleviprex - lipid panel   Afib  - supposed to be on Eliquis, but apparently not taking, restart when okay with neuro - currently rate  controlled - optimize electrolytes   DM - CBG w/ prn SSI - A1c 6.1  Normocytic anemia - trend on CBC   Best Practice (right click and "Reselect all SmartList Selections" daily)   Diet/type: NPO DVT prophylaxis SCD Pressure ulcer(s): N/A GI prophylaxis: PPI Lines: Arterial Line Foley:  Yes, and it is still needed Code Status:  full code Last date of multidisciplinary goals of care discussion [per primary team]  Labs   CBC: Recent Labs  Lab 02/23/24 1509 02/23/24 1514  02/23/24 1841 02/24/24 0511  WBC 8.2  --   --  8.1  NEUTROABS 6.9  --   --  6.1  HGB 10.9* 11.9* 11.6* 9.1*  HCT 34.6* 35.0* 34.0* 29.5*  MCV 95.1  --   --  96.4  PLT 268  --   --  255    Basic Metabolic Panel: Recent Labs  Lab 02/23/24 1509 02/23/24 1514 02/23/24 1725 02/23/24 1841 02/24/24 0511  NA 137 136  --  137 138  K 3.4* 3.3*  --  3.4* 4.5  CL 101 99  --   --  103  CO2 25  --   --   --  24  GLUCOSE 217* 220*  --   --  88  BUN 15 15  --   --  13  CREATININE 0.78 0.70  --   --  0.79  CALCIUM 8.9  --   --   --  8.1*  MG  --   --  1.6*  --   --    GFR: Estimated Creatinine Clearance: 57.1 mL/min (by C-G formula based on SCr of 0.79 mg/dL). Recent Labs  Lab 02/23/24 1509 02/24/24 0511  WBC 8.2 8.1    Liver Function Tests: Recent Labs  Lab 02/23/24 1509  AST 20  ALT 14  ALKPHOS 61  BILITOT 1.0  PROT 7.1  ALBUMIN 3.7   No results for input(s): "LIPASE", "AMYLASE" in the last 168 hours. No results for input(s): "AMMONIA" in the last 168 hours.  ABG    Component Value Date/Time   PHART 7.307 (L) 02/23/2024 1841   PCO2ART 55.0 (H) 02/23/2024 1841   PO2ART 87 02/23/2024 1841   HCO3 27.7 02/23/2024 1841   TCO2 29 02/23/2024 1841   O2SAT 96 02/23/2024 1841     Coagulation Profile: Recent Labs  Lab 02/23/24 1509  INR 1.4*    Cardiac Enzymes: Recent Labs  Lab 02/23/24 1725  CKTOTAL 97    HbA1C: Hgb A1c MFr Bld  Date/Time Value Ref Range Status  02/23/2024 05:25 PM 6.1 (H) 4.8 - 5.6 % Final    Comment:    (NOTE) Pre diabetes:          5.7%-6.4%  Diabetes:              >6.4%  Glycemic control for   <7.0% adults with diabetes     CBG: Recent Labs  Lab 02/23/24 1509 02/23/24 2017 02/23/24 2327 02/24/24 0320 02/24/24 0742  GLUCAP 214* 209* 138* 81 95       Critical care time: 32 mins        Brietta Manso V. Vassie Loll MD [facility] See Amion for pager If no response to pager , please call [phone] until  7pm After 7:00 pm call Elink  [phone]  02/24/2024, 9:35 AM

## 2024-02-25 ENCOUNTER — Inpatient Hospital Stay (HOSPITAL_COMMUNITY)

## 2024-02-25 DIAGNOSIS — I10 Essential (primary) hypertension: Secondary | ICD-10-CM | POA: Diagnosis not present

## 2024-02-25 DIAGNOSIS — Z720 Tobacco use: Secondary | ICD-10-CM

## 2024-02-25 DIAGNOSIS — J9600 Acute respiratory failure, unspecified whether with hypoxia or hypercapnia: Secondary | ICD-10-CM | POA: Diagnosis not present

## 2024-02-25 DIAGNOSIS — I639 Cerebral infarction, unspecified: Secondary | ICD-10-CM | POA: Diagnosis not present

## 2024-02-25 LAB — CBC WITH DIFFERENTIAL/PLATELET
Abs Immature Granulocytes: 0.07 10*3/uL (ref 0.00–0.07)
Basophils Absolute: 0 10*3/uL (ref 0.0–0.1)
Basophils Relative: 0 %
Eosinophils Absolute: 0.2 10*3/uL (ref 0.0–0.5)
Eosinophils Relative: 2 %
HCT: 32.2 % — ABNORMAL LOW (ref 36.0–46.0)
Hemoglobin: 10 g/dL — ABNORMAL LOW (ref 12.0–15.0)
Immature Granulocytes: 1 %
Lymphocytes Relative: 8 %
Lymphs Abs: 0.7 10*3/uL (ref 0.7–4.0)
MCH: 29.9 pg (ref 26.0–34.0)
MCHC: 31.1 g/dL (ref 30.0–36.0)
MCV: 96.1 fL (ref 80.0–100.0)
Monocytes Absolute: 1 10*3/uL (ref 0.1–1.0)
Monocytes Relative: 10 %
Neutro Abs: 7.2 10*3/uL (ref 1.7–7.7)
Neutrophils Relative %: 79 %
Platelets: 243 10*3/uL (ref 150–400)
RBC: 3.35 MIL/uL — ABNORMAL LOW (ref 3.87–5.11)
RDW: 15.9 % — ABNORMAL HIGH (ref 11.5–15.5)
WBC: 9.1 10*3/uL (ref 4.0–10.5)
nRBC: 0 % (ref 0.0–0.2)

## 2024-02-25 LAB — BASIC METABOLIC PANEL
Anion gap: 6 (ref 5–15)
BUN: 15 mg/dL (ref 8–23)
CO2: 23 mmol/L (ref 22–32)
Calcium: 8 mg/dL — ABNORMAL LOW (ref 8.9–10.3)
Chloride: 105 mmol/L (ref 98–111)
Creatinine, Ser: 0.75 mg/dL (ref 0.44–1.00)
GFR, Estimated: >60 mL/min (ref >60)
Glucose, Bld: 164 mg/dL — ABNORMAL HIGH (ref 70–99)
Potassium: 4.1 mmol/L (ref 3.5–5.1)
Sodium: 134 mmol/L — ABNORMAL LOW (ref 135–145)

## 2024-02-25 LAB — GLUCOSE, CAPILLARY
Glucose-Capillary: 155 mg/dL — ABNORMAL HIGH (ref 70–99)
Glucose-Capillary: 163 mg/dL — ABNORMAL HIGH (ref 70–99)
Glucose-Capillary: 170 mg/dL — ABNORMAL HIGH (ref 70–99)
Glucose-Capillary: 118 mg/dL — ABNORMAL HIGH (ref 70–99)
Glucose-Capillary: 131 mg/dL — ABNORMAL HIGH (ref 70–99)
Glucose-Capillary: 119 mg/dL — ABNORMAL HIGH (ref 70–99)

## 2024-02-25 LAB — MAGNESIUM: Magnesium: 2.3 mg/dL (ref 1.7–2.4)

## 2024-02-25 LAB — PHOSPHORUS: Phosphorus: 3.6 mg/dL (ref 2.5–4.6)

## 2024-02-25 MED ORDER — ASPIRIN 81 MG PO CHEW
81.0000 mg | CHEWABLE_TABLET | Freq: Every day | ORAL | Status: DC
  Administered 2024-02-26: 81 mg
  Filled 2024-02-25: qty 1

## 2024-02-25 MED ORDER — ASPIRIN 300 MG RE SUPP: 300.0000 mg | Freq: Every day | RECTAL | Status: DC

## 2024-02-25 MED ORDER — GLYCOPYRROLATE 0.2 MG/ML IJ SOLN
0.2000 mg | Freq: Three times a day (TID) | INTRAMUSCULAR | Status: DC
  Administered 2024-02-26 (×2): 0.2 mg via INTRAVENOUS
  Filled 2024-02-25 (×2): qty 1

## 2024-02-25 MED ORDER — SODIUM CHLORIDE 0.9 % IV SOLN: INTRAVENOUS | Status: DC

## 2024-02-25 MED ORDER — LABETALOL HCL 5 MG/ML IV SOLN
INTRAVENOUS | Status: AC
  Filled 2024-02-25: qty 4

## 2024-02-25 MED ORDER — CLEVIDIPINE BUTYRATE 0.5 MG/ML IV EMUL
0.0000 mg/h | INTRAVENOUS | Status: DC
  Administered 2024-02-26: 2 mg/h via INTRAVENOUS
  Administered 2024-02-26: 20 mg/h via INTRAVENOUS
  Filled 2024-02-25 (×2): qty 100

## 2024-02-25 MED ORDER — ENOXAPARIN SODIUM 40 MG/0.4ML IJ SOSY
40.0000 mg | PREFILLED_SYRINGE | INTRAMUSCULAR | Status: DC
  Administered 2024-02-25 – 2024-02-26 (×2): 40 mg via SUBCUTANEOUS
  Filled 2024-02-25 (×2): qty 0.4

## 2024-02-25 MED ORDER — LABETALOL HCL 5 MG/ML IV SOLN
10.0000 mg | INTRAVENOUS | Status: DC | PRN
  Administered 2024-02-25 – 2024-02-26 (×4): 10 mg via INTRAVENOUS
  Filled 2024-02-25 (×3): qty 4

## 2024-02-25 NOTE — Procedures (Signed)
 Extubation Procedure Note  Patient Details:   Name: Stephanie Pace DOB: 1941-09-06 MRN: [id number]   Airway Documentation:    Vent end date: 02/25/24 Vent end time: 0941   Evaluation  O2 sats: stable throughout Complications: No apparent complications Patient did tolerate procedure well. Bilateral Breath Sounds: Diminished   Yes  Pt extubated to 5l [place] with RN at bedside. Positive cuff leak noted and vitals stable. RT will monitor.  Lajuan Lines 02/25/2024, 9:41 AM

## 2024-02-25 NOTE — Progress Notes (Signed)
 eLink Physician-Brief Progress Note Patient Name: Stephanie Pace DOB: 05/24/1941 MRN: [id number]   Date of Service  02/25/2024  HPI/Events of Note  Extubated today after code stroke, substantial secretions that are difficult to expectorate or aspirated Revankar.  Weak cough.  eICU Interventions  Initiate short duration glycopyrrolate, NT suction as needed     Intervention Category Intermediate Interventions: Respiratory distress - evaluation and management  [name] 02/25/2024, 11:45 PM

## 2024-02-25 NOTE — Progress Notes (Signed)
 STROKE TEAM PROGRESS NOTE    SIGNIFICANT HOSPITAL EVENTS  3/18: Presented as a code stroke Pace to garbled speech, right sided deficits.   NIH 22 on admission CTA shows left MCA core infarct Patient taken for emergent mechanical thrombectomy w/ TICI 3 3/19: MRI shows left MCA distribution infarct   INTERIM HISTORY/SUBJECTIVE  RN at bedside. Family at bedside updated of Pace of care and assessment, thorough discussion.  Patient extubated today.  On exam she is globally aphasic with intermittent ability to follow commands x 1 or 2 then seems to perseverate, right facial droop, left gaze preference, right arm weakness with drift slight right leg weakness.   OBJECTIVE  CBC    Component Value Date/Time   WBC 9.1 02/25/2024 0446   RBC 3.35 (L) 02/25/2024 0446   HGB 10.0 (L) 02/25/2024 0446   HCT 32.2 (L) 02/25/2024 0446   PLT 243 02/25/2024 0446   MCV 96.1 02/25/2024 0446   MCH 29.9 02/25/2024 0446   MCHC 31.1 02/25/2024 0446   RDW 15.9 (H) 02/25/2024 0446   LYMPHSABS 0.7 02/25/2024 0446   MONOABS 1.0 02/25/2024 0446   EOSABS 0.2 02/25/2024 0446   BASOSABS 0.0 02/25/2024 0446    BMET    Component Value Date/Time   NA 134 (L) 02/25/2024 0446   K 4.1 02/25/2024 0446   CL 105 02/25/2024 0446   CO2 23 02/25/2024 0446   GLUCOSE 164 (H) 02/25/2024 0446   BUN 15 02/25/2024 0446   CREATININE 0.75 02/25/2024 0446   CALCIUM 8.0 (L) 02/25/2024 0446   GFRNONAA >60 02/25/2024 0446    IMAGING past 24 hours ECHOCARDIOGRAM COMPLETE Result Date: 02/24/2024    ECHOCARDIOGRAM REPORT   Patient Name:   Stephanie Pace Date of Exam: 02/24/2024 Medical Rec #:  [id number]   Height:       64.0 in Accession #:    [id number]  Weight:       186.9 lb Date of Birth:  Mar 19, 1941   BSA:          1.901 m Patient Age:    83 years    BP:           119/69 mmHg Patient Gender: F           HR:           76 bpm. Exam Location:  Inpatient Procedure: 2D Echo, Cardiac Doppler and Color Doppler (Both Spectral and  Color            Flow Doppler were utilized during procedure). Indications:    Stroke I63.9  History:        Patient has prior history of Echocardiogram examinations, most                 recent 03/19/2021. Stroke and COPD, Arrythmias:Atrial                 Fibrillation; Risk Factors:Hypertension, Diabetes and                 Dyslipidemia.  Sonographer:    Lucendia Herrlich RCS Referring Phys: [id number] DEVON SHAFER IMPRESSIONS  1. Left ventricular ejection fraction, by estimation, is 70 to 75%. The left ventricle has hyperdynamic function. The left ventricle has no regional wall motion abnormalities. Left ventricular diastolic function could not be evaluated.  2. Right ventricular systolic function is mildly reduced. The right ventricular size is normal. There is normal pulmonary artery systolic pressure.  3. Left atrial size was severely dilated.  4. Right  atrial size was moderately dilated.  5. There is a small mobile density on the posterior mitral valve annulus. Measures 0.85 x 0.47 cm. Best seen on image 6. Likely degenerative. Images from 03/2021 were reviewed and images are unchanged. The mitral valve is degenerative. Mild mitral valve regurgitation. No evidence of mitral stenosis.  6. The aortic valve is tricuspid. There is mild calcification of the aortic valve. There is mild thickening of the aortic valve. Aortic valve regurgitation is not visualized. No aortic stenosis is present.  7. The inferior vena cava is dilated in size with >50% respiratory variability, suggesting right atrial pressure of 8 mmHg. FINDINGS  Left Ventricle: Left ventricular ejection fraction, by estimation, is 70 to 75%. The left ventricle has hyperdynamic function. The left ventricle has no regional wall motion abnormalities. The left ventricular internal cavity size was normal in size. There is no left ventricular hypertrophy. Left ventricular diastolic function could not be evaluated Pace to atrial fibrillation. Left ventricular  diastolic function could not be evaluated. Indeterminate filling pressures. Right Ventricle: The right ventricular size is normal. No increase in right ventricular wall thickness. Right ventricular systolic function is mildly reduced. There is normal pulmonary artery systolic pressure. The tricuspid regurgitant velocity is 2.62 m/s, and with an assumed right atrial pressure of 8 mmHg, the estimated right ventricular systolic pressure is 35.5 mmHg. Left Atrium: Left atrial size was severely dilated. Right Atrium: Right atrial size was moderately dilated. Pericardium: There is no evidence of pericardial effusion. Mitral Valve: There is a small mobile density on the posterior mitral valve annulus. Measures 0.85 x 0.47 cm. Best seen on image 6. Likely degenerative. Images from 03/2021 were reviewed and images are unchanged. The mitral valve is degenerative in appearance. There is mild calcification of the mitral valve leaflet(s). Mild to moderate mitral annular calcification. Mild mitral valve regurgitation. No evidence of mitral valve stenosis. Tricuspid Valve: The tricuspid valve is normal in structure. Tricuspid valve regurgitation is mild . No evidence of tricuspid stenosis. Aortic Valve: The aortic valve is tricuspid. There is mild calcification of the aortic valve. There is mild thickening of the aortic valve. Aortic valve regurgitation is not visualized. No aortic stenosis is present. Aortic valve peak gradient measures 6.6 mmHg. Pulmonic Valve: The pulmonic valve was normal in structure. Pulmonic valve regurgitation is trivial. No evidence of pulmonic stenosis. Aorta: The aortic root is normal in size and structure. Venous: The inferior vena cava is dilated in size with greater than 50% respiratory variability, suggesting right atrial pressure of 8 mmHg. IAS/Shunts: No atrial level shunt detected by color flow Doppler.  LEFT VENTRICLE PLAX 2D LVIDd:         3.80 cm   Diastology LVIDs:         2.30 cm   LV e'  medial:    6.24 cm/s LV PW:         1.70 cm   LV E/e' medial:  20.7 LV IVS:        1.40 cm   LV e' lateral:   9.89 cm/s LVOT diam:     1.90 cm   LV E/e' lateral: 13.0 LV SV:         51 LV SV Index:   27 LVOT Area:     2.84 cm  RIGHT VENTRICLE            IVC RV S prime:     8.03 cm/s  IVC diam: 2.20 cm TAPSE (M-mode): 1.2 cm LEFT ATRIUM  Index        RIGHT ATRIUM           Index LA diam:        4.50 cm 2.37 cm/m   RA Area:     22.10 cm LA Vol (A2C):   72.3 ml 38.03 ml/m  RA Volume:   71.20 ml  37.45 ml/m LA Vol (A4C):   87.4 ml 45.95 ml/m LA Biplane Vol: 82.0 ml 43.13 ml/m  AORTIC VALVE AV Area (Vmax): 2.14 cm AV Vmax:        128.00 cm/s AV Peak Grad:   6.6 mmHg LVOT Vmax:      96.50 cm/s LVOT Vmean:     64.600 cm/s LVOT VTI:       0.179 m  AORTA Ao Root diam: 3.30 cm Ao Asc diam:  3.10 cm MITRAL VALVE                TRICUSPID VALVE MV Area (PHT): 5.02 cm     TR Peak grad:   27.5 mmHg MV Decel Time: 151 msec     TR Vmax:        262.00 cm/s MR Peak grad: 33.6 mmHg MR Vmax:      290.00 cm/s   SHUNTS MV E velocity: 129.00 cm/s  Systemic VTI:  0.18 m MV A velocity: 84.70 cm/s   Systemic Diam: 1.90 cm MV E/A ratio:  1.52 Chilton Si MD Electronically signed by Chilton Si MD Signature Date/Time: 02/24/2024/3:00:03 PM    Final     Vitals:   02/25/24 0400 02/25/24 0500 02/25/24 0600 02/25/24 0749  BP: (!) 141/113 (!) 148/66    Pulse: 74 (!) 51 77   Resp: (!) 21 (!) 22 (!) 22   Temp: 99.6 F (37.6 C)     TempSrc: Axillary     SpO2: 99% 96% 98% 100%  Weight:      Height:         PHYSICAL EXAM General: Lethargic, well-nourished, well-developed patient in no acute distress CV: Regular rate and rhythm on monitor Respiratory: Unlabored respirations, 2 L nasal cannula postextubation. GI: Abdomen soft and nontender   NEURO:  Lethargic, opens eyes to voice.  Global aphasia but with intermittent ability to follow commands, some perseveration seen. Naming and repetition is not  intact.  Minimal phonation with decreased responses to questions as conversation continues.  CN: Left gaze preference, does not barely cross midline. Not blinking to visual threat on right. Right facial droop Not cooperative with tongue protrusion or shoulder shrug. Hearing subjectively intact to voice.  Motor:/Sensory: RUE: 4 -/5, moderate drift LUE: 4/5, no drift LLE: Withdrawal to painful stimuli RLE: Withdraw to painful stimuli, less than left.  Most Recent NIH: 55   ASSESSMENT/PLAN  [name] is a 83 y.o. female with history of HTN, DM, A-fib not on anticoagulation who presented as a code stroke Pace to garbled speech and right side deficits after being found on the ground by her daughter.  She was taken for emergent mechanical thrombectomy Pace to a left MCA occlusion.Marland KitchenNIH on Admission: 22.  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 Associated petechial blood products without frank hemorrhagic transformation or significant regional mass effect. 2D Echo: EF 70 to 75%. There is a small mobile density on the posterior mitral valve annulus. Measures 0.85 x 0.47 cm. Images from 03/2021 were reviewed and images are unchanged. The mitral valve is degenerative.  LDL 63 HgbA1c 6.1 VTE prophylaxis - lovenox Aspirin prior to admission, continue aspirin 81 mg daily. No DAPT at this time Pace to petechial hemorrhage.  Will consider AC in 5-7 days given the size of infarct and petechial hemorrhage Therapy recommendations:  SNF Disposition:   pending, palliative Care is also consulted.   Atrial fibrillation In 03/2021 cardiology referral note, recommend to continue Eliquis 5 mg twice daily Per daughter, patient is not currently taking Eliquis.  EMS found a bottle of Eliquis from 2022, no refills documented in the system. Pace to size of stroke, will start Eliquis in 5 to 7 days  Hypertension Home meds:  amlodipine 5 and clonidine 0.1 bid and spironolactone 100  Stable On home amlodipine and clonidine BP goal less than 180/105 Long-term BP goal normotensive  Hyperlipidemia Home meds: Lipitor 20 mg LDL 63, goal < 70 Continue home Lipitor Continue statin at discharge  Tobacco Abuse Patient is a current smoker      Ready to quit? N/A Nicotine replacement therapy available  Dysphagia Patient has post-stroke dysphagia, SLP consulted Currently n.p.o. CorTrak to be placed tomorrow. 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 WBC 8.1--11.2->9.1  Hospital day # 2   Pt seen by Neuro NP/APP and later by MD. Note/Pace to be edited by MD as needed.    Stephanie January, DNP, AGACNP-BC [facility] Please use AMION for contact information & EPIC for messaging.  ATTENDING NOTE: I reviewed above note and agree with the assessment and Pace. Pt was seen and examined.   Stepdaughter and RN are at bedside.  Patient drowsy and lethargic, intermittently open eyes, with eyes open patient is awake, alert, eyes open, global aphasia, except seem to be able to follow commands of eyes open close and mouth open.  Then patient perseverated on mouth open for other commands.  Not able to name or repeat  with minimal phonation.  Left gaze preference, barely cross midline, not blinking to visual threat on the right.  Right facial droop. Tongue protrusion not corporative, but midline in the mouth. LUE against gravity no drift, right upper extremity able to against gravity but with drift to bed within 10 seconds.  Moving both lower extremities to painful stimuli, left stronger than the right.  Sensation, coordination not corporative and gait not tested.   Pt extubated this am and currently tolerating well. Still has global aphasia and R hemiparesis. Still NPO will need cortrak in am and then TF. PT and OT recommend SNF. Now on ASA, consider eliquis in 5-7 days post stroke. BP on the high end, will resume home BP meds after cortrak in am. Palliative care consulted. I had long discussion with stepdaughter at bedside, updated pt current condition, treatment Pace and potential prognosis, and answered all the questions.  She expressed understanding and appreciation.   For detailed assessment and Pace, please refer to above/below as I have made changes wherever appropriate.   Stephanie Plan, MD PhD Stroke Neurology 02/25/2024 10:26 PM  This patient is critically ill Pace to large MCA infarct, status post IR, AF not on AC, and at significant risk of neurological worsening, death form recurrent stroke, hemorrhagic conversion, cerebral edema, heart failure, seizure. This patient's care requires constant monitoring of vital signs, hemodynamics, respiratory and cardiac monitoring, review of multiple databases, neurological assessment, discussion with family, other specialists and medical decision making of high complexity. I spent 40 minutes of neurocritical care time in the care of this patient.

## 2024-02-25 NOTE — Progress Notes (Signed)
 NAME:  Stephanie Pace, MRN:  [id number], DOB:  Jun 19, 1941, LOS: 2 ADMISSION DATE:  02/23/2024, CONSULTATION DATE:  02/23/24 REFERRING MD:  Dr. Corliss Skains, CHIEF COMPLAINT:  AMS    History of Present Illness:  83 yo F PMH Afib -- Rx eliquis but reportedly is not taking, "mini stroke" w some residual R sided weakness was found down by daughter 02/23/24 1355 prompting ED presentation. LKN 0330. Had R sided weakness, L gaze deviation. In ED found to have L MCA territory stroke, with LVO Was intubated and went for thrombectomy with NIR Post procedural CT with some contrast staining and small SAH   Remains intubated after the case PCCM consulted in this setting   Pertinent  Medical History  Afib HTN "Mini stroke" w/ possible mild residual deficits, independent in ADLs CKD 2  DM2  HLD pHTN RV dysfunction  Significant Hospital Events: Including procedures, antibiotic start and stop dates in addition to other pertinent events   3/18 Admitted, NIR 3/19 MRI shows evolving left MCA CVA  Interim History / Subjective:   Febrile yesterday Critically ill, intubated On Precedex   Objective   Blood pressure (!) 166/76, pulse 77, temperature 99.7 F (37.6 C), temperature source Axillary, resp. rate (!) 22, height 5\' 4"  (1.626 m), weight 84.8 kg, SpO2 95%.    Vent Mode: PSV;CPAP FiO2 (%):  [40 %] 40 % Set Rate:  [20 bmp] 20 bmp Vt Set:  [430 mL-460 mL] 460 mL PEEP:  [5 cmH20] 5 cmH20 Pressure Support:  [8 cmH20-10 cmH20] 8 cmH20 Plateau Pressure:  [17 cmH20-18 cmH20] 18 cmH20   Intake/Output Summary (Last 24 hours) at 02/25/2024 0948 Last data filed at 02/25/2024 0600 Gross per 24 hour  Intake 705.46 ml  Output 925 ml  Net -219.54 ml   Filed Weights   02/23/24 1513 02/23/24 1632  Weight: 84.8 kg 84.8 kg   Examination:  General:  Critically ill obese elderly female sedated on MV in NAD HEENT: MM pink/moist, edentulous, ETT, pupils 3/r, anicteric Neuro: Calm, grips hand and sticks  out tongue to command, withdraws both lower extremities CV: irir, afib, no murmur, R femoral site with bulky dressing, +dp PULM: Bilateral ventilated breath sounds, no accessory muscle use, right-sided rhonchi GI: obese, +bs, NT, foley- cyu Extremities: warm/dry, no pitting tibial edema edema  Skin: no rashes  Chest x-ray does not show infiltrates or effusions. Labs show mild hyponatremia, no leukocytosis  Resolved Hospital Problem list     Assessment & Plan:   Acute L MCA CVA s/p NIR mechanical thrombectomy w/TICI 3 revascularization  - hx of mild right sided deficits w/previous CVA but able independent in ADLs P:  - Per Neuro and NIR - SBP goal 120-140 - trend neuro exams - further stroke workup per Neuro - PT/ OT/ SLP when appropriate    Acute respiratory insufficiency related to above Tobacco abuse -Tolerated spontaneous breathing trial and extubated - triple therapy via nebs - cessation counseling when appropriate, nicotine patch prn    HTN HLD - off cleviprex for SBP 120-140 - prn hydralazine -Resume home amlodipine and clonidine and try to stay off Cleviprex - lipid panel   Afib  - supposed to be on Eliquis, but apparently not taking, restart when okay with neuro  DM - CBG w/ prn SSI - A1c 6.1    Best Practice (right click and "Reselect all SmartList Selections" daily)   Diet/type: NPO DVT prophylaxis SCD Pressure ulcer(s): N/A GI prophylaxis: PPI Lines: Arterial Line Foley:  Yes, and it is still needed Code Status:  full code Last date of multidisciplinary goals of care discussion [per primary team]  Labs   CBC: Recent Labs  Lab 02/23/24 1509 02/23/24 1514 02/23/24 1841 02/24/24 0511 02/24/24 1537 02/25/24 0446  WBC 8.2  --   --  8.1 11.2* 9.1  NEUTROABS 6.9  --   --  6.1  --  7.2  HGB 10.9* 11.9* 11.6* 9.1* 9.8* 10.0*  HCT 34.6* 35.0* 34.0* 29.5* 31.5* 32.2*  MCV 95.1  --   --  96.4 95.7 96.1  PLT 268  --   --  255 252 243     Basic Metabolic Panel: Recent Labs  Lab 02/23/24 1509 02/23/24 1514 02/23/24 1725 02/23/24 1841 02/24/24 0511 02/25/24 0446  NA 137 136  --  137 138 134*  K 3.4* 3.3*  --  3.4* 4.5 4.1  CL 101 99  --   --  103 105  CO2 25  --   --   --  24 23  GLUCOSE 217* 220*  --   --  88 164*  BUN 15 15  --   --  13 15  CREATININE 0.78 0.70  --   --  0.79 0.75  CALCIUM 8.9  --   --   --  8.1* 8.0*  MG  --   --  1.6*  --   --  2.3  PHOS  --   --   --   --   --  3.6   GFR: Estimated Creatinine Clearance: 57.1 mL/min (by C-G formula based on SCr of 0.75 mg/dL). Recent Labs  Lab 02/23/24 1509 02/24/24 0511 02/24/24 1537 02/25/24 0446  WBC 8.2 8.1 11.2* 9.1    Liver Function Tests: Recent Labs  Lab 02/23/24 1509  AST 20  ALT 14  ALKPHOS 61  BILITOT 1.0  PROT 7.1  ALBUMIN 3.7   No results for input(s): "LIPASE", "AMYLASE" in the last 168 hours. No results for input(s): "AMMONIA" in the last 168 hours.  ABG    Component Value Date/Time   PHART 7.307 (L) 02/23/2024 1841   PCO2ART 55.0 (H) 02/23/2024 1841   PO2ART 87 02/23/2024 1841   HCO3 27.7 02/23/2024 1841   TCO2 29 02/23/2024 1841   O2SAT 96 02/23/2024 1841     Coagulation Profile: Recent Labs  Lab 02/23/24 1509  INR 1.4*    Cardiac Enzymes: Recent Labs  Lab 02/23/24 1725  CKTOTAL 97    HbA1C: Hgb A1c MFr Bld  Date/Time Value Ref Range Status  02/23/2024 05:25 PM 6.1 (H) 4.8 - 5.6 % Final    Comment:    (NOTE) Pre diabetes:          5.7%-6.4%  Diabetes:              >6.4%  Glycemic control for   <7.0% adults with diabetes     CBG: Recent Labs  Lab 02/24/24 1545 02/24/24 1924 02/24/24 2323 02/25/24 0309 02/25/24 0746  GLUCAP 141* 159* 167* 155* 163*       Critical care time: 31 mins        Ernan Runkles V. Vassie Loll MD [facility] See Amion for pager If no response to pager , please call [phone] until 7pm After 7:00 pm call Elink  [phone]  02/25/2024,  9:48 AM

## 2024-02-25 NOTE — Evaluation (Signed)
 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  Extubated 3/20.   PMH CVA afib HTN CKDII DM2 HLD RV dysfunction   Clinical Impression  Pt in bed upon arrival of PT, agreeable to evaluation at this time. The pt as unable to answer questions about living situation, PLOF, or assist available after d/c, and no family present at this time. The pt was able to follow commands with increased time, but unable to answer questions consistently, even with yes/no options. The pt required maxA of 2 for bed mobility, sit-stand attempts, and small lateral steps along EOB. Due to deficits in strength, coordination, balance, and motor planning, pt needing assist to power up to standing, block bilateral knees, assist with hip facilitation, and trunk extension. Will continue to benefit from skilled PT acutely and continued inpatient rehab <3hours/day after d/c to maximize functional safety and independence.       If plan is discharge home, recommend the following: Two people to help with walking and/or transfers;Two people to help with bathing/dressing/bathroom;Assistance with cooking/housework;Assistance with feeding;Direct supervision/assist for financial management;Direct supervision/assist for medications management;Help with stairs or ramp for entrance;Assist for transportation;Supervision due to cognitive status   Can travel by private vehicle   Yes    Equipment Recommendations Hospital bed;Hoyer lift;Wheelchair (measurements PT);Wheelchair cushion (measurements PT)  Recommendations for Other Services       Functional Status Assessment Patient has had a recent decline in their functional status and demonstrates the ability to make  significant improvements in function in a reasonable and predictable amount of time.     Precautions / Restrictions Precautions Precautions: Fall Recall of Precautions/Restrictions: Impaired Precaution/Restrictions Comments: right hemi paresis Restrictions Weight Bearing Restrictions Per Provider Order: No      Mobility  Bed Mobility Overal bed mobility: Needs Assistance Bed Mobility: Supine to Sit, Sit to Supine     Supine to sit: Max assist, +2 for safety/equipment, HOB elevated Sit to supine: Max assist, +2 for safety/equipment   General bed mobility comments: Pt needed assist with bringing trunk up to sitting, bringing LEs off of the bed, and for scooting out to the edge.  Assist for controlling trunk to supine and lifting LEs into the bed when laying down.    Transfers Overall transfer level: Needs assistance Equipment used: None Transfers: Sit to/from Stand, Bed to chair/wheelchair/BSC Sit to Stand: +2 safety/equipment, +2 physical assistance, Max assist   Step pivot transfers: Max assist, +2 physical assistance, +2 safety/equipment       General transfer comment: pt assisting with LLE but assist to RLE blocking at knee to rise and maintain extension at hips and knees. Pt needing assist with wt shift to facilitate small pivotal steps    Ambulation/Gait Ambulation/Gait assistance: Max assist, +2 physical assistance Gait Distance (Feet): 3 Feet Assistive device: None Gait Pattern/deviations: Step-to pattern, Decreased stride length, Knee flexed in stance - right, Knee flexed in stance - left, Shuffle, Trunk flexed Gait velocity: decreased Gait velocity interpretation: <1.31 ft/sec, indicative of household ambulator   General Gait Details: pt with small lateral steps, assist to maintain upright, block knees, extend hips, facilitate wt shift    Modified Rankin (Stroke Patients Only) Modified Rankin (Stroke Patients Only) Pre-Morbid Rankin Score: Moderate  disability Modified  Rankin: Moderately severe disability     Balance Overall balance assessment: Needs assistance Sitting-balance support: Feet unsupported, Bilateral upper extremity supported Sitting balance-Leahy Scale: Poor   Postural control: Posterior lean Standing balance support: During functional activity Standing balance-Leahy Scale: Zero Standing balance comment: Needs therapist assist +2 for standing                             Pertinent Vitals/Pain Pain Assessment Pain Assessment: Faces Faces Pain Scale: No hurt Pain Intervention(s): Monitored during session    Home Living Family/patient expects to be discharged to:: Skilled nursing facility                   Additional Comments: No family present and pt unable to provide PLOF information.  Per chart pt's daughter and daughter's boyfriend live in the basement of the patients house.  Pt will need 24 hour supervision post discharge.    Prior Function Prior Level of Function : Independent/Modified Independent                     Extremity/Trunk Assessment   Upper Extremity Assessment Upper Extremity Assessment: Defer to OT evaluation RUE Deficits / Details: PROM WFLs with active movement noted in the shoulder and elbow.  PROM WFLs at the hand, no active movement noted in the digits. RUE Sensation:  (unable to determine secondary to expressive deficits.) RUE Coordination: decreased fine motor;decreased gross motor    Lower Extremity Assessment Lower Extremity Assessment: Generalized weakness;RLE deficits/detail;LLE deficits/detail RLE Deficits / Details: pt allowed for PROM in supine and followed commands for ROM when sitting EOB. unable to answer for sensation LLE Deficits / Details: pt not allowig PROM when in bed, not following commands for movement at EOB    Cervical / Trunk Assessment Cervical / Trunk Assessment: Other exceptions Cervical / Trunk Exceptions: Head rotated to the  left at rest, resistance to cervical ROM to the right.  Communication   Communication Communication: Impaired Factors Affecting Communication: Difficulty expressing self;Reduced clarity of speech    Cognition Arousal: Lethargic Behavior During Therapy: Flat affect   PT - Cognitive impairments: No family/caregiver present to determine baseline, Difficult to assess Difficult to assess due to: Impaired communication                     PT - Cognition Comments: pt with inconsistent command following, unable to answer questions but following commands with increased time. Following commands: Impaired Following commands impaired: Follows one step commands with increased time (Pt able to follow approximately 50% of basic one step commands.)     Cueing Cueing Techniques: Verbal cues, Gestural cues, Tactile cues     General Comments General comments (skin integrity, edema, etc.): VSS    Exercises     Assessment/Plan    PT Assessment Patient needs continued PT services  PT Problem List Decreased strength;Decreased range of motion;Decreased activity tolerance;Decreased balance;Decreased mobility;Decreased coordination;Decreased cognition;Decreased safety awareness       PT Treatment Interventions DME instruction;Gait training;Stair training;Functional mobility training;Therapeutic exercise;Balance training;Therapeutic activities;Neuromuscular re-education;Cognitive remediation;Patient/family education    PT Goals (Current goals can be found in the Care Plan section)  Acute Rehab PT Goals Patient Stated Goal: none stated PT Goal Formulation: With patient Time For Goal Achievement: 03/10/24 Potential to Achieve Goals: Fair    Frequency Min 2X/week     Co-evaluation PT/OT/SLP Co-Evaluation/Treatment: Yes Reason for Co-Treatment: Complexity of the patient's impairments (multi-system  involvement);Necessary to address cognition/behavior during functional activity;For  patient/therapist safety;To address functional/ADL transfers PT goals addressed during session: Mobility/safety with mobility;Balance;Strengthening/ROM OT goals addressed during session: ADL's and self-care       AM-PAC PT "6 Clicks" Mobility  Outcome Measure Help needed turning from your back to your side while in a flat bed without using bedrails?: A Lot Help needed moving from lying on your back to sitting on the side of a flat bed without using bedrails?: A Lot Help needed moving to and from a bed to a chair (including a wheelchair)?: Total Help needed standing up from a chair using your arms (e.g., wheelchair or bedside chair)?: Total Help needed to walk in hospital room?: Total Help needed climbing 3-5 steps with a railing? : Total 6 Click Score: 8    End of Session Equipment Utilized During Treatment: Gait belt;Oxygen Activity Tolerance: Patient tolerated treatment well Patient left: in bed;with call bell/phone within reach;with bed alarm set;with SCD's reapplied Nurse Communication: Mobility status;Need for lift equipment PT Visit Diagnosis: Unsteadiness on feet (R26.81);Other abnormalities of gait and mobility (R26.89);Repeated falls (R29.6);Muscle weakness (generalized) (M62.81);Hemiplegia and hemiparesis Hemiplegia - Right/Left: Right Hemiplegia - dominant/non-dominant: Dominant Hemiplegia - caused by: Cerebral infarction    Time: 1302-1329 PT Time Calculation (min) (ACUTE ONLY): 27 min   Charges:   PT Evaluation $PT Eval Moderate Complexity: 1 Mod   PT General Charges $$ ACUTE PT VISIT: 1 Visit         Vickki Muff, PT, DPT   Acute Rehabilitation Department Office [phone] Secure Chat Communication Preferred  Ronnie Derby 02/25/2024, 2:50 PM

## 2024-02-25 NOTE — Evaluation (Addendum)
 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  Extubated 3/20.   PMH CVA afib HTN CKDII DM2 HLD RV dysfunction     Clinical Impressions Pt currently at mod to max +2 assistance for simulated LB selfcare tasks sit to stand and taking a few small steps up the EOB.  Expressive difficulties noted with some active movement noted in the RUE at the shoulder and elbow spontaneously but no movement noted in the hand.  Pt with expressive deficits and right neglect/inattention as she maintained head turn to the left and resisted assist to midline.  Visually she would not track at this time.  Prior to admission she was living with her daughter and the daughter's boyfriend.  According to the chart, the daughter and boyfriend lived in the basement.  Unsure of how much assist she needed for home management or ADLs, but feel based on living situation she was fairly independent.  BP slightly elevated during session at 157/63 in supine with HOB up and then increasing post activity to 168/83 once back in the bed with HOB elevated up to 45 degrees.  Now based on current status she will benefit from acute care OT to help increase balance, awareness, attention, safety, and RUE functional use so she can be more independent with basic ADL tasks.  Recommend continued post acute OT at inpatient facility providing therapy, <3 hours/day.      If plan is discharge home, recommend the following:   A lot of help with walking and/or transfers;A lot of help with bathing/dressing/bathroom;Assistance with feeding;Direct supervision/assist for medications management;Help with stairs or ramp for entrance;Assist for transportation;Direct  supervision/assist for financial management;Assistance with cooking/housework;Supervision due to cognitive status     Functional Status Assessment   Patient has had a recent decline in their functional status and demonstrates the ability to make significant improvements in function in a reasonable and predictable amount of time.     Equipment Recommendations   Other (comment) (TBD next venue of care)      Precautions/Restrictions   Precautions Precautions: Fall Recall of Precautions/Restrictions: Impaired Precaution/Restrictions Comments: right hemi paresis Restrictions Weight Bearing Restrictions Per Provider Order: No     Mobility Bed Mobility Overal bed mobility: Needs Assistance Bed Mobility: Supine to Sit, Sit to Supine     Supine to sit: Max assist, +2 for safety/equipment, HOB elevated Sit to supine: Max assist, +2 for safety/equipment   General bed mobility comments: Pt needed assist with bringing trunk up to sitting, bringing LEs off of the bed, and for scooting out to the edge.  Assist for controlling trunk to supine and lifting LEs into the bed when laying down.    Transfers Overall transfer level: Needs assistance Equipment used: None Transfers: Sit to/from Stand, Bed to chair/wheelchair/BSC Sit to Stand: Mod assist, +2 safety/equipment, +2 physical assistance     Step pivot transfers: Max assist, +2 physical assistance, +2 safety/equipment     General transfer comment: Pt with decreased hip extension in standing with posteriorl bias noted.  She was able to initiate and complete small steps up the side of the bed with mod to max +2 before laying back down.      Balance Overall balance assessment: Needs assistance Sitting-balance  support: Feet unsupported, Bilateral upper extremity supported Sitting balance-Leahy Scale: Poor   Postural control: Posterior lean Standing balance support: During functional activity Standing balance-Leahy Scale:  Zero Standing balance comment: Needs therapist assist +2 for standing                           ADL either performed or assessed with clinical judgement   ADL Overall ADL's : Needs assistance/impaired     Grooming: Wash/dry face;Maximal assistance;Bed level Grooming Details (indicate cue type and reason): Max hand over hand using the RUE Upper Body Bathing: Maximal assistance;Sitting   Lower Body Bathing: Maximal assistance;+2 for safety/equipment   Upper Body Dressing : Maximal assistance;Sitting   Lower Body Dressing: Sit to/from stand;Total assistance;+2 for safety/equipment Lower Body Dressing Details (indicate cue type and reason): simulated Toilet Transfer: Maximal assistance;+2 for physical assistance;+2 for safety/equipment Toilet Transfer Details (indicate cue type and reason): simulated to take steps up toward the top of the bed Toileting- Clothing Manipulation and Hygiene: Maximal assistance;+2 for physical assistance;+2 for safety/equipment;Sit to/from stand       Functional mobility during ADLs: Maximal assistance;+2 for physical assistance (to stand at side of bed and take steps up the EOB) General ADL Comments: Pt needed max +2 for supine to sit EOB.  At EOB initially requiring max assist for static sitting balance but progressed to mod assist.  She was able to initiate some reciprical scooting on the left side with min assist, max assist to complete on the right.   BP at 157/63 in sitting with an increase post standing once back in supine with HOB elevated 45 degrees at 168/83.  Oxygen sats at 98% on 4Ls nasal cannula with HR elevating from 78 at rest up to 107 with standing.  In sitting, pt maintains head turn and gaze to the right.  She would not turn her head to midline and resisted therapist's assist to range it.     Vision Baseline Vision/History: 0 No visual deficits Ability to See in Adequate Light: 0 Adequate Patient Visual Report: Other (comment)  (Pt uanble to state) Vision Assessment?: Wears glasses for reading;Vision impaired- to be further tested in functional context Additional Comments: Pt with right head turn and right head gaze.  Decreased visual attention for further vision testing as she would close her eyes and then would not look at therapist's fingers consistently in the left visual field for tracking.     Perception Perception: Impaired Preception Impairment Details: Inattention/Neglect     Praxis Praxis: Impaired   Praxis-Other Comments: To be further assessed in function.   Pertinent Vitals/Pain Pain Assessment Pain Assessment: Faces Faces Pain Scale: No hurt     Extremity/Trunk Assessment Upper Extremity Assessment Upper Extremity Assessment: RUE deficits/detail (Pt moves LUE spontaneously from shoulder to digits and able to squeeze hand and release.) RUE Deficits / Details: PROM WFLs with active movement noted in the shoulder and elbow.  PROM WFLs at the hand, no active movement noted in the digits. RUE Sensation:  (unable to determine secondary to expressive deficits.) RUE Coordination: decreased fine motor;decreased gross motor   Lower Extremity Assessment Lower Extremity Assessment: Defer to PT evaluation   Cervical / Trunk Assessment Cervical / Trunk Assessment: Other exceptions Cervical / Trunk Exceptions: Head rotated to the left at rest, resistance to cervical ROM to the right.   Communication Communication Communication: Impaired Factors Affecting Communication: Difficulty expressing self;Reduced clarity of speech   Cognition Arousal: Lethargic Behavior  During Therapy: Flat affect Cognition: No family/caregiver present to determine baseline                               Following commands: Impaired Following commands impaired: Follows one step commands with increased time (Pt able to follow approximately 50% of basic one step commands.)     Cueing  General Comments   Cueing  Techniques: Verbal cues;Gestural cues;Tactile cues              Home Living Family/patient expects to be discharged to:: Skilled nursing facility                                 Additional Comments: No family present and pt unable to provide PLOF information.  Per chart pt's daughter and daughter's boyfriend live in the basement of the patients house.  Pt will need 24 hour supervision post discharge.      Prior Functioning/Environment Prior Level of Function : Independent/Modified Independent                    OT Problem List: Decreased strength;Impaired vision/perception;Decreased knowledge of use of DME or AE;Decreased activity tolerance;Impaired balance (sitting and/or standing);Decreased cognition;Decreased range of motion;Decreased coordination;Pain;Impaired sensation   OT Treatment/Interventions: Self-care/ADL training;Patient/family education;Balance training;Therapeutic activities;Neuromuscular education;Therapeutic exercise;Manual therapy;DME and/or AE instruction;Cognitive remediation/compensation      OT Goals(Current goals can be found in the care plan section)   Acute Rehab OT Goals Patient Stated Goal: Pt unable to state this session. OT Goal Formulation: Patient unable to participate in goal setting Time For Goal Achievement: 03/10/24 Potential to Achieve Goals: Good   OT Frequency:  Min 2X/week    Co-evaluation PT/OT/SLP Co-Evaluation/Treatment: Yes     OT goals addressed during session: ADL's and self-care      AM-PAC OT "6 Clicks" Daily Activity     Outcome Measure Help from another person eating meals?: A Lot Help from another person taking care of personal grooming?: A Lot Help from another person toileting, which includes using toliet, bedpan, or urinal?: Total Help from another person bathing (including washing, rinsing, drying)?: Total Help from another person to put on and taking off regular upper body clothing?: A  Lot Help from another person to put on and taking off regular lower body clothing?: Total 6 Click Score: 9   End of Session Equipment Utilized During Treatment: Gait belt;Oxygen Nurse Communication: Mobility status  Activity Tolerance: Patient tolerated treatment well Patient left: in bed;with call bell/phone within reach;with bed alarm set  OT Visit Diagnosis: Unsteadiness on feet (R26.81);Other abnormalities of gait and mobility (R26.89);Muscle weakness (generalized) (M62.81);Feeding difficulties (R63.3);Cognitive communication deficit (R41.841);Other symptoms and signs involving cognitive function;Hemiplegia and hemiparesis Symptoms and signs involving cognitive functions: Cerebral infarction Hemiplegia - Right/Left: Right Hemiplegia - dominant/non-dominant: Dominant Hemiplegia - caused by: Cerebral infarction                Time: 1301-1330 OT Time Calculation (min): 29 min Charges:  OT General Charges $OT Visit: 1 Visit OT Evaluation $OT Eval Moderate Complexity: 1 Mod  Perrin Maltese, OTR/L Acute Rehabilitation Services  Office [phone] 02/25/2024

## 2024-02-25 NOTE — Progress Notes (Signed)
 PT Cancellation Note  Patient Details Name: Stephanie Pace MRN: [id number] DOB: [birth year]   Cancelled Treatment:    Reason Eval/Treat Not Completed: Patient at procedure or test/unavailable, arrived with OT and RN asked to hold due to plans to extubate in just a little bit. Will return later for PT evaluation.   Vickki Muff, PT, DPT   Acute Rehabilitation Department Office [phone] Secure Chat Communication Preferred   Ronnie Derby 02/25/2024, 9:57 AM

## 2024-02-26 ENCOUNTER — Other Ambulatory Visit: Payer: Self-pay | Admitting: Neurology

## 2024-02-26 DIAGNOSIS — J9600 Acute respiratory failure, unspecified whether with hypoxia or hypercapnia: Secondary | ICD-10-CM | POA: Diagnosis not present

## 2024-02-26 DIAGNOSIS — Z515 Encounter for palliative care: Secondary | ICD-10-CM

## 2024-02-26 DIAGNOSIS — R4701 Aphasia: Secondary | ICD-10-CM | POA: Diagnosis not present

## 2024-02-26 DIAGNOSIS — I6602 Occlusion and stenosis of left middle cerebral artery: Secondary | ICD-10-CM | POA: Diagnosis not present

## 2024-02-26 DIAGNOSIS — R1310 Dysphagia, unspecified: Secondary | ICD-10-CM

## 2024-02-26 DIAGNOSIS — I639 Cerebral infarction, unspecified: Secondary | ICD-10-CM | POA: Diagnosis not present

## 2024-02-26 DIAGNOSIS — Z7189 Other specified counseling: Secondary | ICD-10-CM

## 2024-02-26 LAB — POCT I-STAT EG7
Acid-base deficit: 5 mmol/L — ABNORMAL HIGH (ref 0.0–2.0)
Bicarbonate: 19.3 mmol/L — ABNORMAL LOW (ref 20.0–28.0)
Calcium, Ion: 1.16 mmol/L (ref 1.15–1.40)
HCT: 31 % — ABNORMAL LOW (ref 36.0–46.0)
Hemoglobin: 10.5 g/dL — ABNORMAL LOW (ref 12.0–15.0)
O2 Saturation: 83 %
Patient temperature: 99.1
Potassium: 5.3 mmol/L — ABNORMAL HIGH (ref 3.5–5.1)
Sodium: 133 mmol/L — ABNORMAL LOW (ref 135–145)
TCO2: 20 mmol/L — ABNORMAL LOW (ref 22–32)
pCO2, Ven: 34.9 mmHg — ABNORMAL LOW (ref 44–60)
pH, Ven: 7.353 (ref 7.25–7.43)
pO2, Ven: 49 mmHg — ABNORMAL HIGH (ref 32–45)

## 2024-02-26 LAB — GLUCOSE, CAPILLARY
Glucose-Capillary: 153 mg/dL — ABNORMAL HIGH (ref 70–99)
Glucose-Capillary: 131 mg/dL — ABNORMAL HIGH (ref 70–99)
Glucose-Capillary: 146 mg/dL — ABNORMAL HIGH (ref 70–99)
Glucose-Capillary: 256 mg/dL — ABNORMAL HIGH (ref 70–99)

## 2024-02-26 LAB — BASIC METABOLIC PANEL
Anion gap: 4 — ABNORMAL LOW (ref 5–15)
BUN: 13 mg/dL (ref 8–23)
CO2: 23 mmol/L (ref 22–32)
Calcium: 7.6 mg/dL — ABNORMAL LOW (ref 8.9–10.3)
Chloride: 108 mmol/L (ref 98–111)
Creatinine, Ser: 0.65 mg/dL (ref 0.44–1.00)
GFR, Estimated: >60 mL/min (ref >60)
Glucose, Bld: 147 mg/dL — ABNORMAL HIGH (ref 70–99)
Potassium: 3.2 mmol/L — ABNORMAL LOW (ref 3.5–5.1)
Sodium: 135 mmol/L (ref 135–145)

## 2024-02-26 MED ORDER — PROSOURCE TF20 ENFIT COMPATIBL EN LIQD
60.0000 mL | Freq: Every day | ENTERAL | Status: DC
  Administered 2024-02-26: 60 mL
  Filled 2024-02-26: qty 60

## 2024-02-26 MED ORDER — OSMOLITE 1.5 CAL PO LIQD
1000.0000 mL | ORAL | Status: DC
  Administered 2024-02-26: 1000 mL

## 2024-02-26 MED ORDER — AMLODIPINE BESYLATE 5 MG PO TABS
5.0000 mg | ORAL_TABLET | Freq: Once | ORAL | Status: AC
  Administered 2024-02-26: 5 mg
  Filled 2024-02-26: qty 1

## 2024-02-26 MED ORDER — ORAL CARE MOUTH RINSE
15.0000 mL | OROMUCOSAL | Status: DC
  Administered 2024-02-26: 15 mL via OROMUCOSAL

## 2024-02-26 MED ORDER — METOPROLOL TARTRATE 5 MG/5ML IV SOLN
2.5000 mg | Freq: Four times a day (QID) | INTRAVENOUS | Status: DC | PRN
  Filled 2024-02-26: qty 5

## 2024-02-26 MED ORDER — BIOTENE DRY MOUTH MT LIQD: 15.0000 mL | OROMUCOSAL | Status: DC | PRN

## 2024-02-26 MED ORDER — GUAIFENESIN 100 MG/5ML PO LIQD
15.0000 mL | ORAL | Status: DC
  Administered 2024-02-26 – 2024-02-27 (×4): 15 mL
  Filled 2024-02-26 (×4): qty 15

## 2024-02-26 MED ORDER — ONDANSETRON 4 MG PO TBDP: 4.0000 mg | ORAL_TABLET | Freq: Four times a day (QID) | ORAL | Status: DC | PRN

## 2024-02-26 MED ORDER — POLYVINYL ALCOHOL 1.4 % OP SOLN: 1.0000 [drp] | Freq: Four times a day (QID) | OPHTHALMIC | Status: DC | PRN

## 2024-02-26 MED ORDER — GLYCOPYRROLATE 0.2 MG/ML IJ SOLN: 0.4000 mg | INTRAMUSCULAR | Status: DC | PRN

## 2024-02-26 MED ORDER — POTASSIUM CHLORIDE 10 MEQ/100ML IV SOLN
10.0000 meq | INTRAVENOUS | Status: AC
  Administered 2024-02-26 (×4): 10 meq via INTRAVENOUS
  Filled 2024-02-26 (×4): qty 100

## 2024-02-26 MED ORDER — SPIRONOLACTONE 25 MG PO TABS
100.0000 mg | ORAL_TABLET | Freq: Every day | ORAL | Status: DC
  Administered 2024-02-26: 100 mg
  Filled 2024-02-26: qty 4

## 2024-02-26 MED ORDER — METOPROLOL TARTRATE 5 MG/5ML IV SOLN: 5.0000 mg | Freq: Once | INTRAVENOUS | Status: DC

## 2024-02-26 MED ORDER — HALOPERIDOL LACTATE 5 MG/ML IJ SOLN: 0.5000 mg | INTRAMUSCULAR | Status: DC | PRN

## 2024-02-26 MED ORDER — AMLODIPINE BESYLATE 10 MG PO TABS: 10.0000 mg | ORAL_TABLET | Freq: Every day | ORAL | Status: DC

## 2024-02-26 MED ORDER — ONDANSETRON HCL 4 MG/2ML IJ SOLN: 4.0000 mg | Freq: Four times a day (QID) | INTRAMUSCULAR | Status: DC | PRN

## 2024-02-26 MED ORDER — LORAZEPAM 2 MG/ML IJ SOLN: 1.0000 mg | INTRAMUSCULAR | Status: DC | PRN

## 2024-02-26 MED ORDER — AMOXICILLIN-POT CLAVULANATE 875-125 MG PO TABS
1.0000 | ORAL_TABLET | Freq: Two times a day (BID) | ORAL | Status: DC
  Administered 2024-02-26: 1
  Filled 2024-02-26 (×3): qty 1

## 2024-02-26 MED ORDER — PREDNISONE 20 MG PO TABS
20.0000 mg | ORAL_TABLET | Freq: Every day | ORAL | Status: DC
  Administered 2024-02-26: 20 mg
  Filled 2024-02-26: qty 1

## 2024-02-26 MED ORDER — POTASSIUM CHLORIDE 20 MEQ PO PACK
40.0000 meq | PACK | Freq: Once | ORAL | Status: AC
Start: 2024-02-26 — End: 2024-02-26
  Administered 2024-02-26: 40 meq
  Filled 2024-02-26: qty 2

## 2024-02-26 MED ORDER — MORPHINE SULFATE (PF) 2 MG/ML IV SOLN
1.0000 mg | INTRAVENOUS | Status: DC | PRN
  Administered 2024-02-26 (×2): 2 mg via INTRAVENOUS
  Filled 2024-02-26 (×2): qty 1

## 2024-02-26 NOTE — Progress Notes (Signed)
 Increased WOB this afternoon with increased HR, afib 100-130's from prior today, on cleviprex 8mg /hr,  HR 123, BP 152/69 (92, RR 28-34, sats 93% 6LNC  Prefers to keep eyes closed but will open, slow to but will follow simple commands. Faint/ incomprehensible phonation. Mouth dry.  Audible congestion with poor cough clearance with increased WOB/ abd breathing.    P: - s/p NTS with decreased congestion, cont NTS prn - aspiration precautions - add CPT while awake.  Stop robinul, add guaifenesin, consider HTS given thick secretions - nebs, steroids, augmentin added this am.  PRN neb now - change to [facility] for WOB.  Not ideal candidate for BiPAP with mental status> did attempt to localize with LUE - remains high risk for reintubation> airway watch - check VBG >> 7.353/ 34.  Not hypercarbic - if HR not improved after HHFNC, can try lopressor x 1 vs consider switching to cardizem gtt for rate control.     Additional CCT 20 mins  Posey Boyer, MSN, AG-ACNP-BC [facility] 02/26/2024, 4:57 PM  See Amion for pager If no response to pager , please call [phone] until 7pm After 7:00 pm call Elink  [phone]

## 2024-02-26 NOTE — Consult Note (Signed)
 Palliative Care Consult Note                                  Date: 02/26/2024   Patient Name: Stephanie Pace  DOB: 07/03/1941  MRN: [id number]  Age / Sex: 83 y.o., female  PCP: Shellia Cleverly, PA Referring Physician: Stroke, Md, MD  Reason for Consultation: Establishing goals of care  HPI/Patient Profile: 83 y.o. female  with past medical history of atrial fibrillation, CKD stage 2, diabetes type 2, HTN, and HLD who presented to the ED on 02/23/2024 after being found down at home by her daughter.  Code stroke was called due to right-sided weakness and left gaze deviation.  She was found to have left MCA territory stroke with LVO. NIH 22 on admission.  She was intubated and went for emergent thrombectomy.  Postprocedural CT showed small SAH. She was extubated 3/20.   Palliative Medicine was consulted for goals of care.   Subjective:   Extensive chart review has been completed prior to meeting with patient/family including labs, vital signs, imaging, progress/consult notes, orders, medications and available advance directive documents.   Patient assessed at bedside and updated received from RN. She has global aphasia, but does follow commands. Cortrak placed today.   I spoke with daughter/[name] by phone to discuss diagnosis, prognosis, GOC, and disposition.  I introduced Palliative Medicine as specialized medical care for people living with serious illness. It focuses on providing relief from the symptoms and stress of a serious illness.   A brief life review was discussed. Patient is widowed; her husband passed away 2 years ago. She has 1 daughter/[name] and 1 stepdaughter/[name]. Prior to admission, patient was independent and fairly active.   We discussed patient's current medical situation and what it means in the larger context of her ongoing co-morbidities. Current clinical status was reviewed.   Discussed the natural disease  trajectory of a significant neurological injury such as stroke, and that patient would not likely return to her previous baseline.   Created space and opportunity for daughter to express thoughts and feelings regarding current medical situation. Values and goals of care were attempted to be elicited.  Daughter states that patient would not want to "live like this". The difference between full scope medical intervention and comfort care was considered. Discussed that comfort care involves de-escalating full scope medical interventions, allowing a natural course to occur.   Daughter does not wish to prolong her mother's current situation and would like to transition to comfort care at this time. We discussed discontinuing unnecessary interventions including lab draws, tube feeds, IV fluids, CBG monitoring, and cardiac monitoring. We discussed utilizing medications to manage pain and other symptoms at end-of-life.   We discussed option to transition to an inpatient hospice facility for a more peaceful setting at EOL - daughter states her preference for facility in [facility].  Question/concerns were addressed. Emotional support provided.     Review of Systems  Unable to perform ROS   Objective:   Primary Diagnoses: Present on Admission:  Stroke (cerebrum) (HCC)  Middle cerebral artery embolism, left   Physical Exam Vitals reviewed.  Constitutional:      General: She is awake. She is not in acute distress.    Appearance: She is ill-appearing.  Pulmonary:     Effort: Pulmonary effort is normal.  Neurological:     Motor: Weakness present.     Comments: aphasia  Palliative Assessment/Data: PPS 30%     Assessment & Plan:   SUMMARY OF RECOMMENDATIONS   Code status changed to DNR Transition to comfort measures Discontinue labs, tube feeds, IV fluids, cleviprex infusion, CBG monitoring Remove cortrak Referral to inpatient hospice - daughter requests facility in [facility] PMT will continue to follow  Primary Decision Maker: NEXT OF KIN - daughter/[name]   Symptom Management:  Morphine prn for pain or dyspnea Lorazepam (ATIVAN) prn for anxiety Haloperidol (HALDOL) prn for agitation  Glycopyrrolate (ROBINUL) for excessive secretions Ondansetron (ZOFRAN) prn for nausea Polyvinyl alcohol (LIQUIFILM TEARS) prn for dry eyes Antiseptic oral rinse (BIOTENE) prn for dry mouth   Prognosis:  < 2 weeks  Discharge Planning:  To Be Determined   Discussed with: Dr. Roda Shutters, Dr. Francine Graven, RN, and [facility]   Thank you for allowing [place] to participate in the care of [facility]   Time Total: 75 minutes  Detailed review of medical records (labs, imaging, vital signs), medically appropriate exam, discussed with treatment team, counseling and education to family, & staff, documenting clinical information, medication management, coordination of care.   Signed by: Sherlean Foot, NP Palliative Medicine Team  Team Phone # [phone]  For individual providers, please see AMION

## 2024-02-26 NOTE — Progress Notes (Addendum)
 STROKE TEAM PROGRESS NOTE    SIGNIFICANT HOSPITAL EVENTS  3/18: Presented as a code stroke due to garbled speech, right sided deficits.   NIH 22 on admission CTA shows left MCA core infarct Patient taken for emergent mechanical thrombectomy w/ TICI 3 3/19: MRI shows left MCA distribution infarct   INTERIM HISTORY/SUBJECTIVE  RN at bedside. No family at the bedside.  He is laying in bed in no apparent distress She is on Cleviprex at 2 mg.  We have restarted her home blood pressure meds, Norvasc 5 mg and clonidine 0.1 mg  Labs this morning K3.2 and replacing will check in the morning.  Tmax overnight 100.3  Now has core Trak tube placed will consult nutrition for tube feed recommendations and start those today  Neurological exam she is eyes open, global aphasia, does not follow commands, right facial droop, right arm can resist gravity and hold in air with drift, bilateral lower extremities can elevate and hold against gravity and are equal in strength  Commend starting Eliquis in 2 to 3 days  OBJECTIVE  CBC    Component Value Date/Time   WBC 9.1 02/25/2024 0446   RBC 3.35 (L) 02/25/2024 0446   HGB 10.0 (L) 02/25/2024 0446   HCT 32.2 (L) 02/25/2024 0446   PLT 243 02/25/2024 0446   MCV 96.1 02/25/2024 0446   MCH 29.9 02/25/2024 0446   MCHC 31.1 02/25/2024 0446   RDW 15.9 (H) 02/25/2024 0446   LYMPHSABS 0.7 02/25/2024 0446   MONOABS 1.0 02/25/2024 0446   EOSABS 0.2 02/25/2024 0446   BASOSABS 0.0 02/25/2024 0446    BMET    Component Value Date/Time   NA 135 02/26/2024 0525   K 3.2 (L) 02/26/2024 0525   CL 108 02/26/2024 0525   CO2 23 02/26/2024 0525   GLUCOSE 147 (H) 02/26/2024 0525   BUN 13 02/26/2024 0525   CREATININE 0.65 02/26/2024 0525   CALCIUM 7.6 (L) 02/26/2024 0525   GFRNONAA >60 02/26/2024 0525    IMAGING past 24 hours DG Abd 1 View Result Date: 02/25/2024 CLINICAL DATA:  NG tube placement EXAM: ABDOMEN - 1 VIEW COMPARISON:  02/23/2024 FINDINGS: NG  tube coils in the mid stomach. IMPRESSION: NG tube in the stomach. Electronically Signed   By: Charlett Nose M.D.   On: 02/25/2024 23:18    Vitals:   02/26/24 1045 02/26/24 1100 02/26/24 1115 02/26/24 1130  BP: (!) 147/74 (!) 149/77 (!) 160/80 (!) 160/76  Pulse: 93 91 (!) 103 99  Resp: (!) 27 (!) 22 (!) 28 (!) 24  Temp:      TempSrc:      SpO2: 94% 96% 97% 96%  Weight:      Height:         PHYSICAL EXAM General: Lethargic, well-nourished, well-developed patient in no acute distress CV: Regular rate and rhythm on monitor Respiratory: Unlabored respirations, on room air GI: Abdomen soft and nontender   NEURO:  Lethargic, opens eyes to voice.  Global aphasia not following commands this morning Naming and repetition is not intact.  Minimal phonation with decreased responses to questions as conversation continues.  CN: Left gaze preference, does not barely cross midline. Not blinking to visual threat on right. Right facial droop Not cooperative with tongue protrusion or shoulder shrug. Hearing subjectively intact to voice.  Motor:/Sensory: RUE: 4 -/5, moderate drift LUE: 4/5, no drift LLE: Withdrawal to painful stimuli RLE: Withdraw to painful stimuli, less than left.  Most Recent NIH: 21  ASSESSMENT/PLAN  Stephanie Pace is a 83 y.o. female with history of HTN, DM, A-fib not on anticoagulation who presented as a code stroke due to garbled speech and right side deficits after being found on the ground by her daughter.  She was taken for emergent mechanical thrombectomy due to a left MCA occlusion.Marland KitchenNIH on Admission: 22.  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 Associated petechial blood products without frank hemorrhagic transformation or significant regional mass effect. 2D Echo: EF 70 to 75%. There is a small mobile density on the posterior mitral valve annulus. Measures 0.85 x 0.47 cm. Images from 03/2021 were reviewed and images are unchanged. The mitral valve is degenerative.  LDL 63 HgbA1c 6.1 VTE prophylaxis - lovenox Aspirin prior to admission, continue aspirin 81 mg daily. No DAPT at this time due to petechial hemorrhage.  Will consider AC in 3-5 days given the size of infarct and petechial hemorrhage Therapy recommendations:  SNF Disposition:  pending, palliative Care is also consulted. Daughter will bring pt living will today  Atrial fibrillation In 03/2021 cardiology referral note, recommend to continue Eliquis 5 mg twice daily Per daughter, patient is not currently taking Eliquis.  EMS found a bottle of Eliquis from 2022, no refills documented in the system. Due to size of stroke, will start Eliquis in 3-5days  Hypertensive Emergency Home meds:  amlodipine 5 and clonidine 0.1 bid and spironolactone 100  Stable On Cleviprex, wean off as able On amlodipine 5->10 On clonidine 0.1 tid On home spironolactone 100 As needed labetalol and hydralazine ordered BP goal less than 180/105 Long-term BP goal normotensive  Hyperlipidemia Home meds: Lipitor 20 mg LDL 63, goal < 70 Continue home Lipitor Continue statin at discharge  Tobacco Abuse Patient is a current smoker      Ready to quit? N/A Nicotine replacement therapy available  Dysphagia Patient has post-stroke dysphagia, SLP consulted Currently n.p.o. 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 WBC 8.1--11.2->9.1  Hospital day # 3   Pt seen by Neuro NP/APP and later by MD. Note/plan to be edited by MD as needed.    Gevena Mart DNP, ACNPC-AG  [facility]  ATTENDING NOTE: I reviewed above note and agree with the assessment and plan. Pt was seen and examined.   RN at bedside.  Patient still has global aphasia, right upper extremity plegia, right lower extremity paresis.  Did not pass swallow, will put a core track today and start tube feeding and increase p.o. BP meds to wean down Cleviprex.  Continue aspirin for now, consider Eliquis 5 to 7 days poststroke.  Continue statin.  Leukocytosis improved.  Palliative care on board, daughter is going to bring Living Will today.  For detailed assessment and plan, please refer to above/below as I have made changes wherever appropriate.   Marvel Plan, MD PhD Stroke Neurology 02/26/2024 3:59 PM  This patient is critically ill due to large MCA infarct, status post IR, AF not on AC, and at significant risk of neurological worsening, death form recurrent stroke, hemorrhagic conversion, cerebral edema, heart failure, seizure. This patient's care requires constant monitoring of vital signs, hemodynamics, respiratory and cardiac monitoring, review of multiple databases, neurological assessment, discussion with family, other specialists and medical decision making of high complexity. I spent 35 minutes of neurocritical care time in the care of this patient.

## 2024-02-26 NOTE — Progress Notes (Signed)
 NAME:  Stephanie Pace, MRN:  [id number], DOB:  [birth year], LOS: 3 ADMISSION DATE:  02/23/2024, CONSULTATION DATE:  02/23/24 REFERRING MD:  Dr. Corliss Skains, CHIEF COMPLAINT:  AMS    History of Present Illness:  83 yo F PMH Afib -- Rx eliquis but reportedly is not taking, "mini stroke" w some residual R sided weakness was found down by daughter 02/23/24 1355 prompting ED presentation. LKN 0330. Had R sided weakness, L gaze deviation. In ED found to have L MCA territory stroke, with LVO Was intubated and went for thrombectomy with NIR Post procedural CT with some contrast staining and small SAH   Remains intubated after the case PCCM consulted in this setting   Pertinent  Medical History  Afib HTN "Mini stroke" w/ possible mild residual deficits, independent in ADLs CKD 2  DM2  HLD pHTN RV dysfunction  Significant Hospital Events: Including procedures, antibiotic start and stop dates in addition to other pertinent events   3/18 Admitted, NIR 3/19 MRI shows evolving left MCA CVA  Interim History / Subjective:   Extubated yesterday Remains on cleviprex this morning for hypertension   Objective   Blood pressure (!) 163/49, pulse 94, temperature 100.3 F (37.9 C), temperature source Axillary, resp. rate (!) 25, height 5\' 4"  (1.626 m), weight 84.8 kg, SpO2 96%.        Intake/Output Summary (Last 24 hours) at 02/26/2024 0750 Last data filed at 02/26/2024 0600 Gross per 24 hour  Intake 1221.85 ml  Output 1050 ml  Net 171.85 ml   Filed Weights   02/23/24 1513 02/23/24 1632  Weight: 84.8 kg 84.8 kg   Examination:  General:  obese elderly woman, no acute distress HEENT: MM pink/moist, edentulous Neuro: PERRL, left gaze, global aphasia, right sided weakness CV: irir, afib, no murmur, R femoral site with bulky dressing, +dp PULM: scattered wheezes GI: obese, +bs, NT, foley Extremities: warm/dry, no pitting tibial edema edema  Skin: no rashes  Resolved Hospital Problem list      Assessment & Plan:   Acute L MCA CVA s/p NIR mechanical thrombectomy w/TICI 3 revascularization  - hx of mild right sided deficits w/previous CVA but able independent in ADLs P:  - Per Neuro and NIR - SBP goal 120-140 - trend neuro exams - further stroke workup per Neuro - PT/ OT/ SLP when appropriate    Acute respiratory insufficiency related to above Concern for aspiration pneumonia Concern for underlying COPD or asthma with  exacerbation Tobacco abuse -Extubated 3/20 - triple therapy via nebs - start prednisone 20mg  daily for 5 days for wheezing - add augmentin twice daily for 5 days for aspiration coverage - cessation counseling when appropriate, nicotine patch prn    HTN HLD - cleviprex for SBP 120-140 - prn hydralazine -Resume home amlodipine and clonidine and try to stay off Cleviprex   Afib  - supposed to be on Eliquis, but apparently not taking, restart when okay with neuro  DM - CBG w/ prn SSI - A1c 6.1  Nutrition - consult to dietician for tubefeeds  Best Practice (right click and "Reselect all SmartList Selections" daily)   Diet/type: tubefeeds and NPO DVT prophylaxis SCD Pressure ulcer(s): N/A GI prophylaxis: PPI Lines: N/A Foley:  Yes, and it is no longer needed Code Status:  full code Last date of multidisciplinary goals of care discussion [per primary team, palliative care following]  Labs   CBC: Recent Labs  Lab 02/23/24 1509 02/23/24 1514 02/23/24 1841 02/24/24 0511 02/24/24 1537 02/25/24 0446  WBC 8.2  --   --  8.1 11.2* 9.1  NEUTROABS 6.9  --   --  6.1  --  7.2  HGB 10.9* 11.9* 11.6* 9.1* 9.8* 10.0*  HCT 34.6* 35.0* 34.0* 29.5* 31.5* 32.2*  MCV 95.1  --   --  96.4 95.7 96.1  PLT 268  --   --  255 252 243    Basic Metabolic Panel: Recent Labs  Lab 02/23/24 1509 02/23/24 1514 02/23/24 1725 02/23/24 1841 02/24/24 0511 02/25/24 0446 02/26/24 0525  NA 137 136  --  137 138 134* 135  K 3.4* 3.3*  --  3.4* 4.5 4.1 3.2*   CL 101 99  --   --  103 105 108  CO2 25  --   --   --  24 23 23   GLUCOSE 217* 220*  --   --  88 164* 147*  BUN 15 15  --   --  13 15 13   CREATININE 0.78 0.70  --   --  0.79 0.75 0.65  CALCIUM 8.9  --   --   --  8.1* 8.0* 7.6*  MG  --   --  1.6*  --   --  2.3  --   PHOS  --   --   --   --   --  3.6  --    GFR: Estimated Creatinine Clearance: 57.1 mL/min (by C-G formula based on SCr of 0.65 mg/dL). Recent Labs  Lab 02/23/24 1509 02/24/24 0511 02/24/24 1537 02/25/24 0446  WBC 8.2 8.1 11.2* 9.1    Liver Function Tests: Recent Labs  Lab 02/23/24 1509  AST 20  ALT 14  ALKPHOS 61  BILITOT 1.0  PROT 7.1  ALBUMIN 3.7   No results for input(s): "LIPASE", "AMYLASE" in the last 168 hours. No results for input(s): "AMMONIA" in the last 168 hours.  ABG    Component Value Date/Time   PHART 7.307 (L) 02/23/2024 1841   PCO2ART 55.0 (H) 02/23/2024 1841   PO2ART 87 02/23/2024 1841   HCO3 27.7 02/23/2024 1841   TCO2 29 02/23/2024 1841   O2SAT 96 02/23/2024 1841     Coagulation Profile: Recent Labs  Lab 02/23/24 1509  INR 1.4*    Cardiac Enzymes: Recent Labs  Lab 02/23/24 1725  CKTOTAL 97    HbA1C: Hgb A1c MFr Bld  Date/Time Value Ref Range Status  02/23/2024 05:25 PM 6.1 (H) 4.8 - 5.6 % Final    Comment:    (NOTE) Pre diabetes:          5.7%-6.4%  Diabetes:              >6.4%  Glycemic control for   <7.0% adults with diabetes     CBG: Recent Labs  Lab 02/25/24 1133 02/25/24 1531 02/25/24 1925 02/25/24 2318 02/26/24 0343  GLUCAP 170* 118* 131* 119* 153*       Critical care time: 35 mins        Melody Comas, MD [facility] Office: [phone]   See Amion for personal pager PCCM on call pager [phone] until 7pm. Please call Elink 7p-7a. [phone]

## 2024-02-26 NOTE — TOC Initial Note (Addendum)
 Transition of Care [facility]) - Initial/Assessment Note    Patient Details  Name: Stephanie Pace MRN: [id number] Date of Birth: 1941/10/16  [facility] CM/SW Contact:    Alesia Richards, RN Phone Number: 02/26/2024, 4:53 PM  Clinical Narrative:                  CM call to patient's room, no family at bedside. Patient asleep. CM call to patient's daughter, Eunice Blase, phone: [phone], no answer, CM left message for return call. CM call to patient's daughter, Marcelino Duster, phone: [phone] regarding TOC screening assessment. CM introduced role and discharge care planning process. Patient's daughter, verbalized understanding and agreement to screening interview. Per patient's daughter, patient's daughters moved in with patient in apartment downstairs in home. Per patient's daughter, patient had access to 3 wheelchairs, 2 canes, 1 rollator. CM and patient's discussed therapy recommendations for SNF. Per patient's daughter, declined SNF recommendations and prefers for patient to have home health or hospice services if poor prognosis. Patient's daughter is amenable to home health. CM call to Tr[facility] regarding home health availability. Per Aaron,unable to staff. CM call to Amy, [facility], no answer, CM left message for return call. CM call to [facility] regarding home health availability. No answer, CM left message for return call.   Per patient's daughter, Marcelino Duster, patient's daughter provides transportation for meeting daily needs/MD appointments. Per patient's daughter, patient was independent in arranging medications for administration.   CM alert to NP Marissa Calamity regarding patient's daughter request for a call regarding patient status.  Expected Discharge Plan: Skilled Nursing Facility Barriers to Discharge: Continued Medical Work up   Patient Goals and CMS Choice Patient states their goals for this hospitalization and ongoing recovery are::  Per patient's daughter Marcelino Duster, "to return home and not a skilled nursing facility"   Choice offered to / list presented to : Adult Children    Expected Discharge Plan and Services   Discharge Planning Services: CM Consult Post Acute Care Choice: Home Health Living arrangements for the past 2 months: Single Family Home        HH Arranged: RN, PT, OT, Nurse's Aide HH Agency: [facility] Date [facility] Agency Contacted: 02/26/24 Time HH Agency Contacted: 1650 Representative spoke with at [facility] Agency: Clifton Custard  Prior Living Arrangements/Services Living arrangements for the past 2 months: Single Family Home Lives with:: Adult Children Patient language and need for interpreter reviewed:: No Do you feel safe going back to the place where you live?: Yes (per patient's daughter, prefers for patient to return home and will consider hospice if patient prognosis is poor)      Need for Family Participation in Patient Care: Yes (Comment) Care giver support system in place?: Yes (comment) Current home services: DME Criminal Activity/Legal Involvement Pertinent to Current Situation/Hospitalization: No - Comment as needed  Activities of Daily Living  IADLs prior to illness per patient's daughter Marcelino Duster comment    Permission Sought/Granted    Not applicable     Emotional Assessment Appearance:: Appears stated age Attitude/Demeanor/Rapport: Intubated (Following Commands or Not Following Commands) Affect (typically observed): Calm Orientation: :  (Intubated) Alcohol / Substance Use: Tobacco Use, Other (comment) (.5 glass on holidays; cigarettes 1.5 pack per day) Psych Involvement: No (comment)  Admission diagnosis:  Stroke (cerebrum) (HCC) [I63.9] Status post stroke [Z86.73] Cerebrovascular accident (CVA), unspecified mechanism (HCC) [I63.9] Middle cerebral artery embolism, 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] Patient Active Problem List   Diagnosis Date Noted   Status  post stroke 02/23/2024   Stroke  (cerebrum) (HCC) 02/23/2024   Middle cerebral artery embolism, 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   Persistent atrial fibrillation (HCC) 01/30/2021   Secondary hypercoagulable state (HCC) 01/30/2021   Hypokalemia 01/30/2015   Type 2 diabetes mellitus without complication (HCC) 08/07/2014   COPD (chronic obstructive pulmonary disease) (HCC) 07/11/2014   Renal insufficiency 07/11/2014   Sciatica 07/11/2014   Essential hypertension 04/04/2014   Generalized anxiety disorder 04/03/2014   Hyperlipidemia LDL goal <100 04/03/2014   Osteoporosis 04/03/2014   PCP:  Shellia Cleverly, PA Pharmacy:   [facility] [phone] - [place], [place] - [address] AT [facility] [address] [place] [address] Phone: [phone] Fax: [phone]  [facility] [address], [address] [place] [address] Phone: [phone] Fax: [phone]     Social Drivers of Health (SDOH) Social History: SDOH Screenings   Food Insecurity: Patient Unable To Answer (02/24/2024)  Housing: Patient Unable To Answer (02/24/2024)  Transportation Needs: Patient Unable To Answer (02/24/2024)  Utilities: Patient Unable To Answer (02/24/2024)  Social Connections: Patient Unable To Answer (02/24/2024)  Tobacco Use: High Risk (01/27/2024)   SDOH Interventions:     Readmission Risk Interventions     No data to display

## 2024-02-26 NOTE — Progress Notes (Signed)
 Initial Nutrition Assessment  DOCUMENTATION CODES:   Not applicable  INTERVENTION:   Initiate tube feeding via Cortrak: Osmolite 1.5 at 45 ml/h (1080 ml per day)  Prosource TF20 60 ml once daily.  Provides 1700 kcal, 88 gm protein, 820 ml free water daily   NUTRITION DIAGNOSIS:   Inadequate oral intake related to inability to eat as evidenced by NPO status.  GOAL:   Patient will meet greater than or equal to 90% of their needs  MONITOR:   Labs, TF tolerance  REASON FOR ASSESSMENT:   Consult Enteral/tube feeding initiation and management  ASSESSMENT:   Pt admitted for stroke with a PMH of HTN, DM, afib. Pt lives independently but daughter lives in basement. Pt with hx of right sided deficits from previous CVA.  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 3/20- extubated 3/21- Cortrak tube inserted   Labs reviewed: Potassium 3.2 L, replaced on this date.  BG levels ranging from 118-170. A1c 6.1%, measured 02/23/24.   Meds reviewed. Colace BID, Miralax, 0-9 units of Novolog, protonix, Cleviprex, potassium chloride.   NUTRITION - FOCUSED PHYSICAL EXAM:  Flowsheet Row Most Recent Value  Orbital Region No depletion  Upper Arm Region No depletion  Thoracic and Lumbar Region No depletion  Buccal Region No depletion  Temple Region No depletion  Clavicle Bone Region No depletion  Clavicle and Acromion Bone Region No depletion  Scapular Bone Region No depletion  Dorsal Hand Unable to assess  Patellar Region No depletion  Anterior Thigh Region No depletion  Posterior Calf Region No depletion  Edema (RD Assessment) None  Hair Reviewed  Eyes Reviewed  Mouth Reviewed  Skin Reviewed  [dry, flaky skin on shins noted.]  Nails Reviewed       Diet Order:   Diet Order             Diet NPO time specified  Diet effective now                   EDUCATION NEEDS:   Not appropriate for  education at this time  Skin:  Skin Assessment: Reviewed RN Assessment (No staged pressure injuries or wounds noted.)  Last BM:  unknown, none recorded since admission  Height:   Ht Readings from Last 1 Encounters:  02/23/24 5\' 4"  (1.626 m)    Weight:   Wt Readings from Last 1 Encounters:  02/23/24 84.8 kg   No weight loss noted since 2022.  Ideal Body Weight:     BMI:  Body mass index is 32.09 kg/m.  Estimated Nutritional Needs:   Kcal:  1500-1750 kcal  Protein:  80-100 g  Fluid:  1 mL/kcal, > 1.5L   Kathrynn Speed, MPH, RD, LDN Clinical Dietitian Contact information can be found at [facility]

## 2024-02-26 NOTE — Procedures (Signed)
 Cortrak  Person Inserting Tube:  Mahala Menghini, RD Tube Type:  Cortrak - 43 inches Tube Size:  10 Tube Location:  Left nare Initial Placement:  Stomach Secured by: Bridle Technique Used to Measure Tube Placement:  Marking at nare/corner of mouth Cortrak Secured At:  72 cm   Cortrak Tube Team Note:  Consult received to place a Cortrak feeding tube.   No x-ray is required. RN may begin using tube.   If the tube becomes dislodged please keep the tube and contact the Cortrak team at [email or web address] for replacement.  If after hours and replacement cannot be delayed, place a NG tube and confirm placement with an abdominal x-ray.    Mertie Clause, MS, RD, LDN Registered Dietitian II Please see AMiON for contact information.

## 2024-02-26 NOTE — Progress Notes (Signed)
 Referring Provider(s): Dr. Caryl Pina, MD  Supervising Physician: Julieanne Cotton  Patient Status:  [facility] - In-pt  Chief Complaint: CODE STROKE  Subjective:  Patient is laying in bed, alert. She remains aphasic, and with safety mitts on. She does shake her head yes when her name is called. She is able to move all extremities, however, she does not respond appropriately to commands.    Allergies: Patient has no known allergies.  Medications: Prior to Admission medications   Medication Sig Start Date End Date Taking? Authorizing Provider  acetaminophen (TYLENOL) 500 MG tablet Take 500 mg by mouth every 6 (six) hours as needed for mild pain or moderate pain.   Yes [provider]  amLODipine (NORVASC) 5 MG tablet Take 5 mg by mouth daily. 12/26/22  Yes [provider]  aspirin EC 81 MG tablet Take 162 mg by mouth daily.   Yes [provider]  atorvastatin (LIPITOR) 20 MG tablet Take by mouth. 06/22/17  Yes [provider]  Calcium Carbonate (CALCIUM 600 PO) Take 1 tablet by mouth daily.   Yes [provider]  Cholecalciferol (VITAMIN D3) 50 MCG (2000 UT) capsule Take 2,000 Units by mouth daily.   Yes [provider]  cloNIDine (CATAPRES) 0.1 MG tablet Take 0.1 mg by mouth 2 (two) times daily.   Yes [provider]  fenofibrate 160 MG tablet Take 160 mg by mouth daily. 10/22/18  Yes [provider]  ferrous sulfate 325 (65 FE) MG EC tablet Take 325 mg by mouth daily with breakfast.   Yes [provider]  gabapentin (NEURONTIN) 300 MG capsule Take 300 mg by mouth 2 (two) times daily. 02/12/23  Yes [provider]  metFORMIN (GLUCOPHAGE) 500 MG tablet Take 500 mg by mouth 2 (two) times daily with a meal. 01/27/19  Yes [provider]  Multiple Vitamins-Minerals (CENTRUM MINIS WOMEN 50+) TABS Take 1 tablet by mouth daily.   Yes [provider]  Multiple Vitamins-Minerals (ZINC PO)  Take 1 tablet by mouth daily.   Yes [provider]  pantoprazole (PROTONIX) 40 MG tablet Take 40 mg by mouth daily. 01/22/21  Yes [provider]  pioglitazone (ACTOS) 30 MG tablet TAKE 1 TABLET BY MOUTH DAILY 07/13/17  Yes [provider]  potassium chloride SA (KLOR-CON M) 20 MEQ tablet Take 1 tablet by mouth daily. 11/06/21  Yes [provider]  spironolactone (ALDACTONE) 100 MG tablet Take 1 tablet by mouth daily. 01/13/23  Yes [provider]  tiZANidine (ZANAFLEX) 4 MG tablet Take 4 mg by mouth every 8 (eight) hours as needed for muscle spasms. 09/15/22  Yes [provider]  apixaban (ELIQUIS) 5 MG TABS tablet Take 1 tablet (5 mg total) by mouth 2 (two) times daily. Patient not taking: Reported on 02/23/2024 03/29/21 04/28/21  Fenton, Levonne Spiller R, PA  HYDROcodone-acetaminophen (NORCO/VICODIN) 5-325 MG tablet Take 1 tablet by mouth every 6 (six) hours as needed. Patient not taking: Reported on 02/23/2024 11/24/23   Nadara Mustard, MD     Vital Signs: BP (!) 193/128 Comment: clevi increased  Pulse (!) 123   Temp 99.4 F (37.4 C) (Axillary)   Resp (!) 32   Ht 5\' 4"  (1.626 m)   Wt 186 lb 15.2 oz (84.8 kg)   SpO2 92%   BMI 32.09 kg/m   Physical Exam Unable to perform adequate neuro exam as patient has expressive aphasia and does not respond to commands appropriately. Alert, awake. Unable to  assess if oriented. Common femoral artery puncture site looks good, no bleeding, no hematoma, no pseudoaneurysm   Labs:  CBC: Recent Labs    02/23/24 1509 02/23/24 1514 02/23/24 1841 02/24/24 0511 02/24/24 1537 02/25/24 0446  WBC 8.2  --   --  8.1 11.2* 9.1  HGB 10.9*   < > 11.6* 9.1* 9.8* 10.0*  HCT 34.6*   < > 34.0* 29.5* 31.5* 32.2*  PLT 268  --   --  255 252 243   < > = values in this interval not displayed.    COAGS: Recent Labs    02/23/24 1509  INR 1.4*  APTT 25    BMP: Recent Labs    02/23/24 1509 02/23/24 1514  02/23/24 1841 02/24/24 0511 02/25/24 0446 02/26/24 0525  NA 137 136 137 138 134* 135  K 3.4* 3.3* 3.4* 4.5 4.1 3.2*  CL 101 99  --  103 105 108  CO2 25  --   --  24 23 23   GLUCOSE 217* 220*  --  88 164* 147*  BUN 15 15  --  13 15 13   CALCIUM 8.9  --   --  8.1* 8.0* 7.6*  CREATININE 0.78 0.70  --  0.79 0.75 0.65  GFRNONAA >60  --   --  >60 >60 >60    LIVER FUNCTION TESTS: Recent Labs    02/23/24 1509  BILITOT 1.0  AST 20  ALT 14  ALKPHOS 61  PROT 7.1  ALBUMIN 3.7    Assessment and Plan:  L MCA infarct s/p mechanical thrombectomy by Dr. Corliss Skains 02/23/24 Patient assessed at bedside.  She is intubated, and alert.  She remains aphasic, and responds poorly to commands.  Moving all extremities.  No gaze deviation noted.  Working with RRT.  Groin procedure site remains stable without issue.    Further care per Neuro.   Electronically Signed: Sable Feil, PA-C 02/26/2024, 2:18 PM     I spent a total of 15 Minutes at the the patient's bedside AND on the patient's hospital floor or unit, greater than 50% of which was counseling/coordinating care for L MCA infarct, s/p revascularization.

## 2024-02-27 DIAGNOSIS — I63512 Cerebral infarction due to unspecified occlusion or stenosis of left middle cerebral artery: Secondary | ICD-10-CM

## 2024-02-27 DIAGNOSIS — Z515 Encounter for palliative care: Secondary | ICD-10-CM | POA: Diagnosis not present

## 2024-02-27 DIAGNOSIS — I639 Cerebral infarction, unspecified: Secondary | ICD-10-CM | POA: Diagnosis not present

## 2024-02-27 MED ORDER — MORPHINE SULFATE (PF) 2 MG/ML IV SOLN
1.0000 mg | INTRAVENOUS | Status: DC
  Administered 2024-02-27 – 2024-02-28 (×8): 1 mg via INTRAVENOUS
  Filled 2024-02-27 (×8): qty 1

## 2024-02-27 NOTE — Plan of Care (Signed)
  Problem: Clinical Measurements: Goal: Quality of life will improve Outcome: Progressing   Problem: Pain Management: Goal: Satisfaction with pain management regimen will improve Outcome: Progressing   

## 2024-02-27 NOTE — TOC Progression Note (Signed)
 Transition of Care [facility]) - Progression Note    Patient Details  Name: Stephanie Pace MRN: [id number] Date of Birth: 11-03-1941  Transition of Care [facility]) CM/SW Contact  Jimmy Picket, [place] Phone Number: 02/27/2024, 12:57 PM  Clinical Narrative:     CSW made referral to [facility] hospice house. They do not have a bed today but they will work up patient and speak with family.  Expected Discharge Plan: Skilled Nursing Facility Barriers to Discharge: Continued Medical Work up  Expected Discharge Plan and Services   Discharge Planning Services: CM Consult Post Acute Care Choice: Home Health Living arrangements for the past 2 months: Single Family Home                           HH Arranged: RN, PT, OT, Nurse's Aide HH Agency: [facility] Date [facility] Agency Contacted: 02/26/24 Time HH Agency Contacted: 1650 Representative spoke with at [facility] Agency: Clifton Custard   Social Determinants of Health (SDOH) Interventions SDOH Screenings   Food Insecurity: Patient Unable To Answer (02/24/2024)  Housing: Patient Unable To Answer (02/24/2024)  Transportation Needs: Patient Unable To Answer (02/24/2024)  Utilities: Patient Unable To Answer (02/24/2024)  Social Connections: Patient Unable To Answer (02/24/2024)  Tobacco Use: High Risk (01/27/2024)    Readmission Risk Interventions    02/26/2024    4:54 PM  Readmission Risk Prevention Plan  Transportation Screening Complete  HRI or Home Care Consult Complete  Medication Review (RN Care Manager) Complete

## 2024-02-27 NOTE — Progress Notes (Signed)
 Pt cortrak has been removed by patient. This nurse cut bridle and notified the MD via Amion. Pt is comfort at this time.

## 2024-02-27 NOTE — Progress Notes (Signed)
 Palliative Medicine Progress Note   Patient Name: Stephanie Pace       Date: 02/27/2024 DOB: [birth year]  Age: 83 y.o. MRN#: [id number] Attending Physician: Stroke, Md, MD Primary Care Physician: Shellia Cleverly, [place] Admit Date: 02/23/2024   HPI/Patient Profile: 83 y.o. female  with past medical history of atrial fibrillation, CKD stage 2, diabetes type 2, HTN, and HLD who presented to the ED on 02/23/2024 after being found down at home by her daughter.  Code stroke was called due to right-sided weakness and left gaze deviation.  She was found to have left MCA territory stroke with LVO. NIH 22 on admission.  She was intubated and went for emergent thrombectomy.  Postprocedural CT showed small SAH. She was extubated 3/20.    Palliative Medicine was consulted for goals of care. After speaking with daughter/[name] on 3/21, patient was transitioned to comfort care.  Subjective: Chart reviewed. Patient assessed at bedside. She appears uncomfortable. She is coughing and respirations appear mildly labored. She is globally aphasic. No family present at bedside.     Objective:  Physical Exam Vitals reviewed.  Constitutional:      General: She is awake.     Appearance: She is ill-appearing.     Comments: Mild distress  Pulmonary:     Effort: Tachypnea present.  Neurological:     Comments: aphasic             Palliative Medicine Assessment & Plan   Assessment: Principal Problem:   Status post stroke Active Problems:   Stroke (cerebrum) (HCC)   Middle cerebral artery embolism, left    Recommendations/Plan: Continue comfort measures Start scheduled morphine IV every 4 hours Referral made to hospice facility in [facility] PMT will continue to follow   Symptom Management:  Morphine prn for  pain or dyspnea Lorazepam (ATIVAN) prn for anxiety Haloperidol (HALDOL) prn for agitation  Glycopyrrolate (ROBINUL) for excessive secretions Ondansetron (ZOFRAN) prn for nausea Polyvinyl alcohol (LIQUIFILM TEARS) prn for dry eyes Antiseptic oral rinse (BIOTENE) prn for dry mouth   Code Status: DNR - comfort   Prognosis:  < 2 weeks  Discharge Planning: To Be Determined    Thank you for allowing the Palliative Medicine Team to assist in the care of this patient.   Time: 26 minutes   Merry Proud, NP  Please contact Palliative Medicine Team phone at [phone] for questions and concerns.  For individual providers, please see AMION.

## 2024-02-27 NOTE — Progress Notes (Signed)
 STROKE TEAM PROGRESS NOTE    SIGNIFICANT HOSPITAL EVENTS  3/18: Presented as a code stroke due to garbled speech, right sided deficits.   NIH 22 on admission CTA shows left MCA core infarct Patient taken for emergent mechanical thrombectomy w/ TICI 3 3/19: MRI shows left MCA distribution infarct   INTERIM HISTORY/SUBJECTIVE Patient continues to have global aphasia and right-sided weakness.  Family made patient DNR and full comfort care measures yesterday.  She has been transferred to the hospice floor.  She is awake but remains globally aphasic with nonsensical speech and not following commands.  Is focusing movements of the left but right upper extremity remains plegic.  She looks comfortable.  No family at the bedside.  OBJECTIVE  CBC    Component Value Date/Time   WBC 9.1 02/25/2024 0446   RBC 3.35 (L) 02/25/2024 0446   HGB 10.5 (L) 02/26/2024 1640   HCT 31.0 (L) 02/26/2024 1640   PLT 243 02/25/2024 0446   MCV 96.1 02/25/2024 0446   MCH 29.9 02/25/2024 0446   MCHC 31.1 02/25/2024 0446   RDW 15.9 (H) 02/25/2024 0446   LYMPHSABS 0.7 02/25/2024 0446   MONOABS 1.0 02/25/2024 0446   EOSABS 0.2 02/25/2024 0446   BASOSABS 0.0 02/25/2024 0446    BMET    Component Value Date/Time   NA 133 (L) 02/26/2024 1640   K 5.3 (H) 02/26/2024 1640   CL 108 02/26/2024 0525   CO2 23 02/26/2024 0525   GLUCOSE 147 (H) 02/26/2024 0525   BUN 13 02/26/2024 0525   CREATININE 0.65 02/26/2024 0525   CALCIUM 7.6 (L) 02/26/2024 0525   GFRNONAA >60 02/26/2024 0525    IMAGING past 24 hours No results found.   Vitals:   02/26/24 1815 02/26/24 2000 02/26/24 2111 02/27/24 0435  BP: (!) 148/60  (!) 166/112 (!) 192/100  Pulse: (!) 114 92 94 (!) 107  Resp: (!) 28 (!) 25 16 16   Temp:   (!) 101 F (38.3 C) 100.2 F (37.9 C)  TempSrc:   Oral Oral  SpO2: 100% (!) 78% (!) 82% (!) 84%  Weight:      Height:         PHYSICAL EXAM General:   well-nourished, well-developed patient in no acute  distress CV: Regular rate and rhythm on monitor Respiratory: Unlabored respirations, on room air GI: Abdomen soft and nontender   NEURO:  Drowsy but opens eyes to voice.  Global aphasia not following commands   Naming and repetition is not intact.  Minimal phonation with decreased responses to questions as conversation continues.  CN: Left gaze preference, does not barely cross midline. Not blinking to visual threat on right. Right facial droop Not cooperative with tongue protrusion or shoulder shrug. Hearing subjectively intact to voice.  Motor:/Sensory: RUE: 4 -/5, moderate drift LUE: 4/5, no drift LLE: Withdrawal to painful stimuli RLE: Withdraw to painful stimuli, less than left.  Most Recent NIH: 21   ASSESSMENT/PLAN  [name] is a 83 y.o. female with history of HTN, DM, A-fib not on anticoagulation who presented as a code stroke due to garbled speech and right side deficits after being found on the ground by her daughter.  She was taken for emergent mechanical thrombectomy due to a left MCA occlusion.Marland KitchenNIH on Admission: 22.  Stroke:  left MCA moderately large infarct with left M2 occlusion s/p IR with TICI3, etiology: Likely due to AF not on [facility] Code Stroke CT head, acute L MCA infarct, ASPECTS 7 CTA head &  neck w/Perfusion - Acute left proximal left M2 occlusion CTP 16/35 cc, but likely under estimated core infarct due to pseudonormalization S/p IR with left M2 occlusion s/p TICI3 Post IR CT: no hemorrhage MRI Evolving acute left MCA distribution infarct involving the left insula and overlying left frontal lobe. Associated petechial blood products without frank hemorrhagic transformation or significant regional mass effect. 2D Echo: EF 70 to 75%. There is a small mobile density on the posterior mitral valve annulus. Measures 0.85 x 0.47 cm. Images from 03/2021 were reviewed and images are unchanged. The mitral valve is degenerative.  LDL 63 HgbA1c 6.1 VTE prophylaxis  - lovenox Aspirin prior to admission, continue aspirin 81 mg daily. No DAPT at this time due to petechial hemorrhage.    Therapy recommendations:  SNF but family chose hospice Disposition:   Hospice  Atrial fibrillation In 03/2021 cardiology referral note, recommend to continue Eliquis 5 mg twice daily Per daughter, patient is not currently taking Eliquis.  EMS found a bottle of Eliquis from 2022, no refills documented in the system. Due to size of stroke, had Eliquis  for 3-5days  Hypertensive Emergency Home meds:  amlodipine 5 and clonidine 0.1 bid and spironolactone 100  Stable On Cleviprex, wean off as able On amlodipine 5->10 On clonidine 0.1 tid On home spironolactone 100 As needed labetalol and hydralazine ordered BP goal less than 180/105 Long-term BP goal normotensive  Hyperlipidemia Home meds: Lipitor 20 mg LDL 63, goal < 70 Continue home Lipitor Continue statin at discharge  Tobacco Abuse Patient is a current smoker      Ready to quit? N/A Nicotine replacement therapy available  Dysphagia Patient has post-stroke dysphagia, SLP consulted Currently n.p.o. 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 WBC 8.1--11.2->9.1  Hospital day # 4      Patient remains globally aphasic with right-sided weakness and as per family wishes patient had made DNR and full comfort care measures.  Patient has pulled out panda tube and we will not replace it.  Continue comfort care measures.  Consider transfer to hospice nursing home after the  weekend if family wishes.  Discussed with patient's RN.  No family at the bedside.  Greater than 50% time during this 35-minute visit was spent on counseling and coordination of care and discussion with patient's care team and answering questions. Delia Heady, MD

## 2024-02-28 DIAGNOSIS — D649 Anemia, unspecified: Secondary | ICD-10-CM | POA: Insufficient documentation

## 2024-02-28 DIAGNOSIS — I161 Hypertensive emergency: Secondary | ICD-10-CM | POA: Insufficient documentation

## 2024-02-28 DIAGNOSIS — R1310 Dysphagia, unspecified: Secondary | ICD-10-CM

## 2024-02-28 DIAGNOSIS — F17200 Nicotine dependence, unspecified, uncomplicated: Secondary | ICD-10-CM | POA: Insufficient documentation

## 2024-02-28 DIAGNOSIS — I63512 Cerebral infarction due to unspecified occlusion or stenosis of left middle cerebral artery: Secondary | ICD-10-CM | POA: Diagnosis not present

## 2024-02-28 DIAGNOSIS — E785 Hyperlipidemia, unspecified: Secondary | ICD-10-CM | POA: Insufficient documentation

## 2024-02-28 DIAGNOSIS — E669 Obesity, unspecified: Secondary | ICD-10-CM | POA: Insufficient documentation

## 2024-02-28 NOTE — Progress Notes (Signed)
 Joanne Chars to be D/C'd  per MD order.  Discussed with Annice Pih, RN at [facility] house and all questions fully answered.  VSS, Skin clean, dry and intact without evidence of skin break down, no evidence of skin tears noted.  IV catheter are still in place per Welcome request from the facility.  An After Visit Summary was printed and given to the PTAR.

## 2024-02-28 NOTE — Consult Note (Signed)
 Patient has been reviewed for the Hospice Home in [facility] with [facility]. I have spoke with daughter Marcelino Duster and she is in agreement for her mother to have comfort care there. She has been approved for the [facility] and they can accept her anytime after 3pm today.  Feel free to call with any questions or concerns. Evette Doffing, RN BSN [facility] [phone]

## 2024-02-28 NOTE — Discharge Summary (Addendum)
 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 s/p IR with TICI3, etiology: Likely due to AF not on [facility]  Patient made DNR and comfort care by family and patient being transferred to hospice nursing home Patient Active Problem List   Diagnosis Date Noted   Hypertensive emergency 02/28/2024   Hyperlipidemia 02/28/2024   Tobacco use disorder 02/28/2024   Dysphagia 02/28/2024   Obesity 02/28/2024   Anemia 02/28/2024   Status post stroke 02/23/2024   Stroke (cerebrum) (HCC) 02/23/2024   Middle cerebral artery embolism, 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   A-fib (HCC) 01/30/2021   Secondary hypercoagulable state (HCC) 01/30/2021   Hypokalemia 01/30/2015   Type 2 diabetes mellitus without complication (HCC) 08/07/2014   COPD (chronic obstructive pulmonary disease) (HCC) 07/11/2014   Renal insufficiency 07/11/2014   Sciatica 07/11/2014   Essential hypertension 04/04/2014   Generalized anxiety disorder 04/03/2014   Hyperlipidemia LDL goal <100 04/03/2014   Osteoporosis 04/03/2014     Secondary Diagnoses: Global aphasia Right hemiplegia Hypertension Hyperlipidemia Atrial fibrillation Tobacco abuse  Allergies as of 02/28/2024   No Known Allergies      Medication List     STOP taking these medications    acetaminophen 500 MG tablet Commonly known as: TYLENOL   amLODipine 5 MG tablet Commonly known as: NORVASC   apixaban 5 MG Tabs tablet Commonly known as: ELIQUIS   aspirin EC 81 MG tablet   atorvastatin 20 MG tablet Commonly known as: LIPITOR   CALCIUM 600 PO   Centrum Minis Women 50+ Tabs   cloNIDine 0.1 MG tablet Commonly known as: CATAPRES   fenofibrate 160 MG tablet    ferrous sulfate 325 (65 FE) MG EC tablet   gabapentin 300 MG capsule Commonly known as: NEURONTIN   HYDROcodone-acetaminophen 5-325 MG tablet Commonly known as: NORCO/VICODIN   metFORMIN 500 MG tablet Commonly known as: GLUCOPHAGE   pantoprazole 40 MG tablet Commonly known as: PROTONIX   pioglitazone 30 MG tablet Commonly known as: ACTOS   potassium chloride SA 20 MEQ tablet Commonly known as: KLOR-CON M   spironolactone 100 MG tablet Commonly known as: ALDACTONE   tiZANidine 4 MG tablet Commonly known as: ZANAFLEX   Vitamin D3 50 MCG (2000 UT) capsule   ZINC PO        LABORATORY STUDIES CBC    Component Value Date/Time   WBC 9.1 02/25/2024 0446   RBC 3.35 (L) 02/25/2024 0446   HGB 10.5 (L) 02/26/2024 1640   HCT 31.0 (L) 02/26/2024 1640   PLT 243 02/25/2024 0446   MCV 96.1 02/25/2024 0446   MCH 29.9 02/25/2024 0446   MCHC 31.1 02/25/2024 0446   RDW 15.9 (H) 02/25/2024 0446   LYMPHSABS 0.7 02/25/2024 0446   MONOABS 1.0 02/25/2024 0446   EOSABS 0.2 02/25/2024 0446   BASOSABS 0.0 02/25/2024 0446   CMP    Component Value Date/Time   NA 133 (L) 02/26/2024 1640   K 5.3 (H) 02/26/2024 1640   CL 108 02/26/2024 0525   CO2 23 02/26/2024 0525   GLUCOSE 147 (H) 02/26/2024 0525   BUN 13 02/26/2024 0525   CREATININE 0.65 02/26/2024 0525   CALCIUM 7.6 (  L) 02/26/2024 0525   PROT 7.1 02/23/2024 1509   ALBUMIN 3.7 02/23/2024 1509   AST 20 02/23/2024 1509   ALT 14 02/23/2024 1509   ALKPHOS 61 02/23/2024 1509   BILITOT 1.0 02/23/2024 1509   GFRNONAA >60 02/26/2024 0525   GFRAA >60 02/03/2019 1024   COAGS Lab Results  Component Value Date   INR 1.4 (H) 02/23/2024   Lipid Panel    Component Value Date/Time   CHOL 112 02/24/2024 0511   TRIG 121 02/24/2024 0511   TRIG 116 02/24/2024 0511   HDL 25 (L) 02/24/2024 0511   CHOLHDL 4.5 02/24/2024 0511   VLDL 24 02/24/2024 0511   LDLCALC 63 02/24/2024 0511   HgbA1C  Lab Results  Component Value Date    HGBA1C 6.1 (H) 02/23/2024    Alcohol Level    Component Value Date/Time   ETH <10 02/23/2024 1509     SIGNIFICANT DIAGNOSTIC STUDIES IR PERCUTANEOUS ART THROMBECTOMY/INFUSION INTRACRANIAL INC DIAG ANGIO Result Date: 02/26/2024 EXAM: 1. EMERGENT LARGE VESSEL OCCLUSION THROMBOLYSIS (anterior CIRCULATION) COMPARISON:  CT angiogram of the head and neck of 02/23/2024. MEDICATIONS: No antibiotic was administered within 1 hour of the procedure. ANESTHESIA/SEDATION: General anesthesia. CONTRAST:  Omnipaque 300 approximately 50 mL. FLUOROSCOPY TIME:  Fluoroscopy Time: 14 minutes 48 seconds (495 mGy). COMPLICATIONS: None immediate. TECHNIQUE: Following a full explanation of the procedure along with the potential associated complications, an informed witnessed consent was obtained. The risks of intracranial hemorrhage of 10%, worsening neurological deficit, ventilator dependency, death and inability to revascularize were all reviewed in detail with the patient's family. The patient was then put under general anesthesia by the Department of Anesthesiology at [facility]. The right groin was prepped and draped in the usual sterile fashion. Thereafter using modified Seldinger technique, transfemoral access into the right common femoral artery was obtained without difficulty. Over an 8 French 25 cm Pinnacle sheath was inserted. Through this, and also over an 0.035 inch guidewire a combination of a 100 cm 088 Zoom support catheter with a 120 cm 6 [place] Bernstein support catheter was advanced to the aortic arch region, and selectively positioned in the proximal left common carotid artery and then in the distal left internal carotid artery. FINDINGS: Left common carotid arteriogram demonstrates the left external carotid artery and its major branches to be widely patent. Left internal carotid artery at the bulb to the cranial skull base is widely patent. The petrous, the cavernous and the supraclinoid ICA  demonstrated wide patency. The left middle cerebral artery opacifies into the capillary and venous phases. There is complete occlusion of the dominant superior division just distal to its origin. The left anterior cerebral artery opacifies into the capillary and venous phases. PROCEDURE: Through the Zoom 088 and the proximal cavernous segment, a combination of an 062 134 cm aspiration catheter with an 043 the distal aspiration catheter was advanced over an 0.018 inch Aristotle micro guidewire through the occluded superior division into the M3 segment followed by advancement of the aspiration catheters into the occluded superior division. The microcatheter guidewire was retrieved. Aspiration was applied at the hub of the 062 aspiration catheter via a pump, and with a 20 mL syringe at the hub of the 043 catheter which was retrieved first followed by aspiration for 2 minutes at the hub of the 062 aspiration catheter. The 062 aspiration catheter was then retrieved and removed. A control arteriogram performed through the 088 in the cavernous left ICA demonstrated complete revascularization of the previously occluded  superior division achieving a TICI 3 revascularization. This also uncovered focal areas of mild to moderate stenosis involving the perisylvian branches of the anterior cerebral artery and to a lesser degree of the inferior division. Similar changes were noted of the anterior cerebral artery A2 A3 region. Mild to moderate spasm involving the superior division responded to at least 3 aliquots of 25 mcg of nitroglycerin. A final control arteriogram performed through the 088 Zoom catheter in the left common carotid artery demonstrates the left internal carotid artery proximally and distally to be widely patent. The left middle cerebral artery continues to maintain a TICI 3 revascularization. The left anterior cerebral artery remains widely patent without evidence of intraluminal filling defects, or of occlusions.  A flat panel CT demonstrates contrast staining in the previously infarcted frontal, and the peri-insular cortical areas extending into the anterior temporal lobe. Minimal subarachnoid hyperattenuation is noted. An 8 French Angio-Seal closure device was deployed at the right groin puncture site. Distal pulses remained patent. Patient was left intubated due to her medical condition prior to the procedure. She was then transferred to the neuro ICU for post revascularization care. IMPRESSION: Status post revascularization of occluded superior division of the left middle cerebral artery with 1 pass within 062 Zoom aspiration catheter and an 043 distal aspiration catheter achieving a TICI 3 revascularization. PLAN: As per referring MD. Electronically Signed   By: Julieanne Cotton M.D.   On: 02/26/2024 07:49   IR CT Head Ltd Result Date: 02/26/2024 EXAM: 1. EMERGENT LARGE VESSEL OCCLUSION THROMBOLYSIS (anterior CIRCULATION) COMPARISON:  CT angiogram of the head and neck of 02/23/2024. MEDICATIONS: No antibiotic was administered within 1 hour of the procedure. ANESTHESIA/SEDATION: General anesthesia. CONTRAST:  Omnipaque 300 approximately 50 mL. FLUOROSCOPY TIME:  Fluoroscopy Time: 14 minutes 48 seconds (495 mGy). COMPLICATIONS: None immediate. TECHNIQUE: Following a full explanation of the procedure along with the potential associated complications, an informed witnessed consent was obtained. The risks of intracranial hemorrhage of 10%, worsening neurological deficit, ventilator dependency, death and inability to revascularize were all reviewed in detail with the patient's family. The patient was then put under general anesthesia by the Department of Anesthesiology at [facility]. The right groin was prepped and draped in the usual sterile fashion. Thereafter using modified Seldinger technique, transfemoral access into the right common femoral artery was obtained without difficulty. Over an 8 French 25 cm  Pinnacle sheath was inserted. Through this, and also over an 0.035 inch guidewire a combination of a 100 cm 088 Zoom support catheter with a 120 cm 6 [place] Bernstein support catheter was advanced to the aortic arch region, and selectively positioned in the proximal left common carotid artery and then in the distal left internal carotid artery. FINDINGS: Left common carotid arteriogram demonstrates the left external carotid artery and its major branches to be widely patent. Left internal carotid artery at the bulb to the cranial skull base is widely patent. The petrous, the cavernous and the supraclinoid ICA demonstrated wide patency. The left middle cerebral artery opacifies into the capillary and venous phases. There is complete occlusion of the dominant superior division just distal to its origin. The left anterior cerebral artery opacifies into the capillary and venous phases. PROCEDURE: Through the Zoom 088 and the proximal cavernous segment, a combination of an 062 134 cm aspiration catheter with an 043 the distal aspiration catheter was advanced over an 0.018 inch Aristotle micro guidewire through the occluded superior division into the M3 segment followed by advancement of  the aspiration catheters into the occluded superior division. The microcatheter guidewire was retrieved. Aspiration was applied at the hub of the 062 aspiration catheter via a pump, and with a 20 mL syringe at the hub of the 043 catheter which was retrieved first followed by aspiration for 2 minutes at the hub of the 062 aspiration catheter. The 062 aspiration catheter was then retrieved and removed. A control arteriogram performed through the 088 in the cavernous left ICA demonstrated complete revascularization of the previously occluded superior division achieving a TICI 3 revascularization. This also uncovered focal areas of mild to moderate stenosis involving the perisylvian branches of the anterior cerebral artery and to a lesser  degree of the inferior division. Similar changes were noted of the anterior cerebral artery A2 A3 region. Mild to moderate spasm involving the superior division responded to at least 3 aliquots of 25 mcg of nitroglycerin. A final control arteriogram performed through the 088 Zoom catheter in the left common carotid artery demonstrates the left internal carotid artery proximally and distally to be widely patent. The left middle cerebral artery continues to maintain a TICI 3 revascularization. The left anterior cerebral artery remains widely patent without evidence of intraluminal filling defects, or of occlusions. A flat panel CT demonstrates contrast staining in the previously infarcted frontal, and the peri-insular cortical areas extending into the anterior temporal lobe. Minimal subarachnoid hyperattenuation is noted. An 8 French Angio-Seal closure device was deployed at the right groin puncture site. Distal pulses remained patent. Patient was left intubated due to her medical condition prior to the procedure. She was then transferred to the neuro ICU for post revascularization care. IMPRESSION: Status post revascularization of occluded superior division of the left middle cerebral artery with 1 pass within 062 Zoom aspiration catheter and an 043 distal aspiration catheter achieving a TICI 3 revascularization. PLAN: As per referring MD. Electronically Signed   By: Julieanne Cotton M.D.   On: 02/26/2024 07:49   DG Abd 1 View Result Date: 02/25/2024 CLINICAL DATA:  NG tube placement EXAM: ABDOMEN - 1 VIEW COMPARISON:  02/23/2024 FINDINGS: NG tube coils in the mid stomach. IMPRESSION: NG tube in the stomach. Electronically Signed   By: Charlett Nose M.D.   On: 02/25/2024 23:18   ECHOCARDIOGRAM COMPLETE Result Date: 02/24/2024    ECHOCARDIOGRAM REPORT   Patient Name:   Stephanie Pace Date of Exam: 02/24/2024 Medical Rec #:  [id number]   Height:       64.0 in Accession #:    [id number]  Weight:       186.9 lb Date of  Birth:  Mar 25, 1941   BSA:          1.901 m Patient Age:    83 years    BP:           119/69 mmHg Patient Gender: F           HR:           76 bpm. Exam Location:  Inpatient Procedure: 2D Echo, Cardiac Doppler and Color Doppler (Both Spectral and Color            Flow Doppler were utilized during procedure). Indications:    Stroke I63.9  History:        Patient has prior history of Echocardiogram examinations, most                 recent 03/19/2021. Stroke and COPD, Arrythmias:Atrial  Fibrillation; Risk Factors:Hypertension, Diabetes and                 Dyslipidemia.  Sonographer:    Lucendia Herrlich RCS Referring Phys: [id number] DEVON SHAFER IMPRESSIONS  1. Left ventricular ejection fraction, by estimation, is 70 to 75%. The left ventricle has hyperdynamic function. The left ventricle has no regional wall motion abnormalities. Left ventricular diastolic function could not be evaluated.  2. Right ventricular systolic function is mildly reduced. The right ventricular size is normal. There is normal pulmonary artery systolic pressure.  3. Left atrial size was severely dilated.  4. Right atrial size was moderately dilated.  5. There is a small mobile density on the posterior mitral valve annulus. Measures 0.85 x 0.47 cm. Best seen on image 6. Likely degenerative. Images from 03/2021 were reviewed and images are unchanged. The mitral valve is degenerative. Mild mitral valve regurgitation. No evidence of mitral stenosis.  6. The aortic valve is tricuspid. There is mild calcification of the aortic valve. There is mild thickening of the aortic valve. Aortic valve regurgitation is not visualized. No aortic stenosis is present.  7. The inferior vena cava is dilated in size with >50% respiratory variability, suggesting right atrial pressure of 8 mmHg. FINDINGS  Left Ventricle: Left ventricular ejection fraction, by estimation, is 70 to 75%. The left ventricle has hyperdynamic function. The left ventricle has no  regional wall motion abnormalities. The left ventricular internal cavity size was normal in size. There is no left ventricular hypertrophy. Left ventricular diastolic function could not be evaluated due to atrial fibrillation. Left ventricular diastolic function could not be evaluated. Indeterminate filling pressures. Right Ventricle: The right ventricular size is normal. No increase in right ventricular wall thickness. Right ventricular systolic function is mildly reduced. There is normal pulmonary artery systolic pressure. The tricuspid regurgitant velocity is 2.62 m/s, and with an assumed right atrial pressure of 8 mmHg, the estimated right ventricular systolic pressure is 35.5 mmHg. Left Atrium: Left atrial size was severely dilated. Right Atrium: Right atrial size was moderately dilated. Pericardium: There is no evidence of pericardial effusion. Mitral Valve: There is a small mobile density on the posterior mitral valve annulus. Measures 0.85 x 0.47 cm. Best seen on image 6. Likely degenerative. Images from 03/2021 were reviewed and images are unchanged. The mitral valve is degenerative in appearance. There is mild calcification of the mitral valve leaflet(s). Mild to moderate mitral annular calcification. Mild mitral valve regurgitation. No evidence of mitral valve stenosis. Tricuspid Valve: The tricuspid valve is normal in structure. Tricuspid valve regurgitation is mild . No evidence of tricuspid stenosis. Aortic Valve: The aortic valve is tricuspid. There is mild calcification of the aortic valve. There is mild thickening of the aortic valve. Aortic valve regurgitation is not visualized. No aortic stenosis is present. Aortic valve peak gradient measures 6.6 mmHg. Pulmonic Valve: The pulmonic valve was normal in structure. Pulmonic valve regurgitation is trivial. No evidence of pulmonic stenosis. Aorta: The aortic root is normal in size and structure. Venous: The inferior vena cava is dilated in size with  greater than 50% respiratory variability, suggesting right atrial pressure of 8 mmHg. IAS/Shunts: No atrial level shunt detected by color flow Doppler.  LEFT VENTRICLE PLAX 2D LVIDd:         3.80 cm   Diastology LVIDs:         2.30 cm   LV e' medial:    6.24 cm/s LV PW:  1.70 cm   LV E/e' medial:  20.7 LV IVS:        1.40 cm   LV e' lateral:   9.89 cm/s LVOT diam:     1.90 cm   LV E/e' lateral: 13.0 LV SV:         51 LV SV Index:   27 LVOT Area:     2.84 cm  RIGHT VENTRICLE            IVC RV S prime:     8.03 cm/s  IVC diam: 2.20 cm TAPSE (M-mode): 1.2 cm LEFT ATRIUM             Index        RIGHT ATRIUM           Index LA diam:        4.50 cm 2.37 cm/m   RA Area:     22.10 cm LA Vol (A2C):   72.3 ml 38.03 ml/m  RA Volume:   71.20 ml  37.45 ml/m LA Vol (A4C):   87.4 ml 45.95 ml/m LA Biplane Vol: 82.0 ml 43.13 ml/m  AORTIC VALVE AV Area (Vmax): 2.14 cm AV Vmax:        128.00 cm/s AV Peak Grad:   6.6 mmHg LVOT Vmax:      96.50 cm/s LVOT Vmean:     64.600 cm/s LVOT VTI:       0.179 m  AORTA Ao Root diam: 3.30 cm Ao Asc diam:  3.10 cm MITRAL VALVE                TRICUSPID VALVE MV Area (PHT): 5.02 cm     TR Peak grad:   27.5 mmHg MV Decel Time: 151 msec     TR Vmax:        262.00 cm/s MR Peak grad: 33.6 mmHg MR Vmax:      290.00 cm/s   SHUNTS MV E velocity: 129.00 cm/s  Systemic VTI:  0.18 m MV A velocity: 84.70 cm/s   Systemic Diam: 1.90 cm MV E/A ratio:  1.52 Chilton Si MD Electronically signed by Chilton Si MD Signature Date/Time: 02/24/2024/3:00:03 PM    Final    MR BRAIN WO CONTRAST Result Date: 02/24/2024 CLINICAL DATA:  Follow-up examination for stroke. EXAM: MRI HEAD WITHOUT CONTRAST TECHNIQUE: Multiplanar, multiecho pulse sequences of the brain and surrounding structures were obtained without intravenous contrast. COMPARISON:  CTs from 02/23/2024 FINDINGS: Brain: Cerebral volume within normal limits. Patchy T2/FLAIR hyperintensity involving the periventricular deep white matter  both cerebral hemispheres, consistent with chronic small vessel ischemic disease, mild for age. Chronic right cerebellar infarct with associated chronic hemosiderin staining. Confluent restricted diffusion involving the left insula and overlying left frontal lobe, consistent with an evolving acute left MCA distribution infarct (series 5, image 79). Area of infarction measures up to 5.5 cm in greatest dimension. Associated petechial blood products without frank hemorrhagic transformation. No significant regional mass effect. No other evidence for acute or subacute ischemia. Gray-white matter differentiation otherwise maintained. No other acute or significant chronic intracranial blood products. No mass lesion, midline shift or mass effect. No hydrocephalus or extra-axial fluid collection. Pituitary gland within normal limits. Vascular: Major intracranial vascular flow voids are maintained. Skull and upper cervical spine: Craniocervical junction within normal limits. Bone marrow signal intensity normal. No scalp soft tissue abnormality. Sinuses/Orbits: Prior bilateral ocular lens replacement. Scattered mucosal thickening present about the sphenoid ethmoidal sinuses. Trace left mastoid effusion, of doubtful significance. Patient appears  to be intubated. Other: None. IMPRESSION: 1. Evolving acute left MCA distribution infarct involving the left insula and overlying left frontal lobe. Associated petechial blood products without frank hemorrhagic transformation or significant regional mass effect. 2. Underlying mild chronic microvascular ischemic disease with chronic right cerebellar infarct. Electronically Signed   By: Rise Mu M.D.   On: 02/24/2024 03:10   DG Abd Portable 1V Result Date: 02/23/2024 CLINICAL DATA:  OG tube placement EXAM: PORTABLE ABDOMEN - 1 VIEW COMPARISON:  None Available. FINDINGS: OG tube tip is in the stomach.  Nonobstructive bowel gas pattern. IMPRESSION: OG tube in the stomach.  Electronically Signed   By: Charlett Nose M.D.   On: 02/23/2024 21:42   DG CHEST PORT 1 VIEW Result Date: 02/23/2024 CLINICAL DATA:  Intubation EXAM: PORTABLE CHEST 1 VIEW COMPARISON:  None Available. FINDINGS: Endotracheal tube 3 cm above the carina. NG tube in place and appears to enter the stomach. See abdominal report. Cardiomegaly, vascular congestion. Perihilar opacities likely reflect early edema. No effusions or acute bony abnormality. IMPRESSION: Endotracheal tube 3 cm above the carina. Cardiomegaly with vascular congestion and probable perihilar edema. Electronically Signed   By: Charlett Nose M.D.   On: 02/23/2024 21:42   CT C-SPINE NO CHARGE Result Date: 02/23/2024 CLINICAL DATA:  Neuro deficit, acute, stroke suspected. Found on ground. EXAM: CT CERVICAL SPINE WITH CONTRAST TECHNIQUE: Multiplanar CT images of the cervical spine were reconstructed from contemporary CTA of the neck. RADIATION DOSE REDUCTION: This exam was performed according to the departmental dose-optimization program which includes automated exposure control, adjustment of the mA and/or kV according to patient size and/or use of iterative reconstruction technique. CONTRAST:  No additional. COMPARISON:  CTA head and neck 06/04/2023 FINDINGS: Alignment: No acute traumatic malalignment. Skull base and vertebrae: No acute fracture or suspicious lesion. Soft tissues and spinal canal: No prevertebral fluid or swelling. No visible canal hematoma. Disc levels: Moderate lower cervical disc degeneration. Asymmetrically advanced right-sided facet arthrosis at C2-3, C3-4, and C4-5. Upper chest: See today's separately reported CTA of the head and neck. IMPRESSION: No acute cervical spine fracture or traumatic malalignment. Electronically Signed   By: Sebastian Ache M.D.   On: 02/23/2024 16:32   CT ANGIO HEAD NECK W WO CM W PERF (CODE STROKE) Result Date: 02/23/2024 CLINICAL DATA:  Neuro deficit, acute, stroke suspected. Right-sided deficits.  EXAM: CT ANGIOGRAPHY HEAD AND NECK CT PERFUSION BRAIN TECHNIQUE: Multidetector CT imaging of the head and neck was performed using the standard protocol during bolus administration of intravenous contrast. Multiplanar CT image reconstructions and MIPs were obtained to evaluate the vascular anatomy. Carotid stenosis measurements (when applicable) are obtained utilizing NASCET criteria, using the distal internal carotid diameter as the denominator. Multiphase CT imaging of the brain was performed following IV bolus contrast injection. Subsequent parametric perfusion maps were calculated using RAPID software. RADIATION DOSE REDUCTION: This exam was performed according to the departmental dose-optimization program which includes automated exposure control, adjustment of the mA and/or kV according to patient size and/or use of iterative reconstruction technique. CONTRAST:  OMNIPAQUE IOHEXOL 350 MG/ML SOLN COMPARISON:  CTA head and neck 06/04/2023 FINDINGS: CTA NECK FINDINGS Aortic arch: Normal variant 4 vessel aortic arch with the left vertebral artery arising directly from the arch. Mild-to-moderate atherosclerotic calcification resulting in approximately 50% stenosis of the origin of the left subclavian artery, similar to the prior CTA. Right carotid system: Patent without evidence of a significant stenosis or dissection. Mild beading of the mid cervical ICA,  stable to slightly more prominent than on the prior study. Left carotid system: Patent without evidence of a significant stenosis or dissection. Vertebral arteries: Patent without evidence of a significant stenosis or dissection. Mildly dominant left vertebral artery. Skeleton: See separately reported cervical spine CT. Other neck: No evidence of cervical lymphadenopathy or mass. Upper chest: Interlobular septal thickening, mild ground-glass opacity, and peribronchial thickening in the included lung apices. Biapical lung scarring. Review of the MIP images  confirms the above findings CTA HEAD FINDINGS Anterior circulation: The internal carotid arteries are patent from skull base to carotid termini with unchanged mild cavernous and supraclinoid stenoses bilaterally. The ACAs and right MCA are patent with moderate branch vessel irregularity but no evidence a flow limiting proximal stenosis. The left M1 segment is widely patent, however there is a new occlusion of the M2 superior division proximally near the bifurcation with mild distal reconstitution. No aneurysm is identified. Posterior circulation: The intracranial vertebral arteries are patent to the basilar with unchanged moderate to severe right and mild left V4 stenoses. Severe bilateral SCA stenoses are again noted. The basilar artery is patent without evidence of a significant stenosis. There are large right and diminutive or absent left posterior communicating arteries with hypoplasia of the right P1 segment. Both PCAs are patent with moderate P2 stenoses bilaterally. No aneurysm is identified. Venous sinuses: Patent. Anatomic variants: Fetal right PCA. Review of the MIP images confirms the above findings CT Brain Perfusion Findings: ASPECTS: 7 CBF (<30%) Volume: 16 mL Perfusion (Tmax>6.0s) volume: 35 mL Mismatch Volume: 19 mL Infarction Location: Left MCA These results were called by telephone at the time of interpretation on 02/23/2024 at 3:36 pm to Dr. Caryl Pina, who verbally acknowledged these results. IMPRESSION: 1. Acute proximal left M2 occlusion. 2. 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 3. Unchanged intracranial atherosclerosis including mild bilateral ICA, moderate bilateral P2, and moderate to severe right V4 stenoses. 4. Widely patent cervical carotid arteries. 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 5. Suspected mild pulmonary edema in the included lung apices. 6.  Aortic Atherosclerosis (ICD10-I70.0). Electronically Signed   By: Sebastian Ache M.D.   On:  02/23/2024 16:11   CT HEAD CODE STROKE WO CONTRAST Result Date: 02/23/2024 CLINICAL DATA:  Code stroke.  Right-sided deficits. EXAM: CT HEAD WITHOUT CONTRAST TECHNIQUE: Contiguous axial images were obtained from the base of the skull through the vertex without intravenous contrast. RADIATION DOSE REDUCTION: This exam was performed according to the departmental dose-optimization program which includes automated exposure control, adjustment of the mA and/or kV according to patient size and/or use of iterative reconstruction technique. COMPARISON:  CTA head and neck 06/04/2023 FINDINGS: Brain: There is evidence of an acute left MCA infarct involving the insula and portions of the posterior frontal, anterior parietal, and superior temporal lobes primarily at the level of the operculum. No acute intracranial hemorrhage, midline shift, or extra-axial fluid collection is identified. There is a moderate-sized chronic infarct inferiorly in the right cerebellar hemisphere. Cerebral volume is within normal limits for age. The ventricles are normal in size. Vascular: Calcified atherosclerosis at the skull base. Hyperdense appearance of the left MCA bifurcation, to be more fully evaluated on pending CTA. Skull: No acute fracture or suspicious lesion. Sinuses/Orbits: Mucous retention cyst laterally in the left sphenoid sinus. Clear mastoid air cells. Bilateral cataract extraction. Other: None. ASPECTS [facility] Stroke Program Early CT Score) - Ganglionic level infarction (caudate, lentiform nuclei, internal capsule, insula, M1-M3 cortex): 5 -  Supraganglionic infarction (M4-M6 cortex): 2 Total score (0-10 with 10 being normal): 7 These results were called by telephone at the time of interpretation on 02/23/2024 at 3:36 pm to Dr. Caryl Pina, who verbally acknowledged these results. IMPRESSION: 1. Acute left MCA infarct. ASPECTS of 7. 2. No acute intracranial hemorrhage. 3. Chronic right cerebellar infarct. Electronically Signed    By: Sebastian Ache M.D.   On: 02/23/2024 15:45       HISTORY OF PRESENT ILLNESS 83 y.o. patient with history of HTN, DM, A-fib not on anticoagulation who presented as a code stroke due to garbled speech and right side deficits after being found on the ground by her daughter. She was taken for emergent mechanical thrombectomy due to a left MCA occlusion.Marland KitchenNIH on Admission: 22.   HOSPITAL COURSE Patient admitted on 02/23/2024  and admitted to the Neuro ICU after a mechanical thrombectomy of Left M2 occlusion with TICI 3 revascularization  Code Stroke CT head, with acute L MCA infarct, ASPECTS 7. CTA head & neck w/Perfusion  with Acute left proximal left M2 occlusion. CTP 16/35 cc, but likely under estimated core infarct due to pseudonormalization. MRI revealed Evolving acute left MCA distribution infarct involving the left insula and overlying left frontal lobe. Associated petechial blood products without frank hemorrhagic transformation or significant regional mass effect. 2D Echo: EF 70 to 75%. There is a small mobile density on the posterior mitral valve annulus. Measures 0.85 x 0.47 cm. Images from 03/2021 were reviewed and images are unchanged. The mitral valve is degenerative.  LDL 63 HgbA1c 6.1  Family opted for comfort measures and requested Hospice home. She has been comfortable and not requiring any medications at this point.  She was deemed stable and ready for discharge.   DISCHARGE EXAM  PHYSICAL EXAM General:  Alert, well-nourished, well-developed patient in no acute distress Psych:  Mood and affect appropriate for situation CV: Regular rate and rhythm on monitor Respiratory:  Regular, unlabored respirations on room air GI: Abdomen soft and nontender  NEURO:  Drowsy but opens eyes to voice.  Global aphasia not following commands   Naming and repetition is not intact.  Minimal phonation with decreased responses to questions as conversation continues.   CN: Left gaze preference,  does not barely cross midline. Not blinking to visual threat on right. Right facial droop Not cooperative with tongue protrusion or shoulder shrug. Hearing subjectively intact to voice.   Motor:/Sensory: RUE: 4 -/5, moderate drift LUE: 4/5, no drift LLE: Withdrawal to painful stimuli RLE: Withdraw to painful stimuli, less than left.   Most Recent NIH: 21  Discharge Diet       Diet   Diet NPO time specified   liquids  DISCHARGE PLAN Disposition:  Hospice Home   40 minutes were spent preparing discharge.   Gevena Mart DNP, ACNPC-AG  [facility]  I have personally obtained history,examined this patient, reviewed notes, independently viewed imaging studies, participated in medical decision making and plan of care.ROS completed by me personally and pertinent positives fully documented  I have made any additions or clarifications directly to the above note. Agree with note above.    Delia Heady, MD Medical Director [facility] Pager: [phone] 02/28/2024 3:48 PM

## 2024-02-28 NOTE — TOC Transition Note (Signed)
 Transition of Care [facility]) - Discharge Note   Patient Details  Name: Stephanie Pace MRN: [id number] Date of Birth: 1941-10-24  Transition of Care [facility] CM/SW Contact:  Ralene Bathe, LCSW Phone Number: 02/28/2024, 1:31 PM   Clinical Narrative:    Patient will DC to:  Hospice [facility] in [place]) Anticipated DC date: 02/28/2024 Family notified: LM for daughter Web designer by:  Alyssa Grove   Per MD patient ready for DC to hospice home. RN to call report prior to discharge [phone]). RN, patient's family, and facility notified of DC.  DC packet on chart. Ambulance transport will be requested for patient.   CSW will sign off for now as social work intervention is no longer needed. Please consult [place] again if new needs arise.    Final next level of care: Hospice Medical Facility Barriers to Discharge: Barriers Resolved   Patient Goals and CMS Choice Patient states their goals for this hospitalization and ongoing recovery are:: Per patient's daughter Marcelino Duster, "to return home and not a skilled nursing facility"   Choice offered to / list presented to : Adult Children      Discharge Placement                Patient to be transferred to facility by: [facility] Name of family member notified: Chrystine Oiler, daughter Patient and family notified of of transfer: 02/28/24  Discharge Plan and Services Additional resources added to the After Visit Summary for     Discharge Planning Services: CM Consult Post Acute Care Choice: Home Health                    HH Arranged: RN, PT, OT, Nurse's Aide [facility] Agency: [facility] Date [facility] Agency Contacted: 02/26/24 Time HH Agency Contacted: 1650 Representative spoke with at [facility] Agency: Clifton Custard  Social Drivers of Health (SDOH) Interventions SDOH Screenings   Food Insecurity: Patient Unable To Answer (02/24/2024)  Housing: Patient Unable To Answer (02/24/2024)  Transportation Needs: Patient Unable To Answer (02/24/2024)   Utilities: Patient Unable To Answer (02/24/2024)  Social Connections: Patient Unable To Answer (02/24/2024)  Tobacco Use: High Risk (01/27/2024)     Readmission Risk Interventions    02/26/2024    4:54 PM  Readmission Risk Prevention Plan  Transportation Screening Complete  HRI or Home Care Consult Complete  Medication Review (RN Care Manager) Complete

## 2024-02-28 NOTE — Progress Notes (Signed)
 Palliative Medicine Progress Note   Patient Name: Stephanie Pace       Date: 02/28/2024 DOB: 1941-08-19  Age: 83 y.o. MRN#: [id number] Attending Physician: Stroke, Md, MD Primary Care Physician: Shellia Cleverly, [place] Admit Date: 02/23/2024  Reason for Follow-up: Symptom management, end-of-life care  HPI/Patient Profile: 83 y.o. female  with past medical history of atrial fibrillation, CKD stage 2, diabetes type 2, HTN, and HLD who presented to the ED on 02/23/2024 after being found down at home by her daughter.  Code stroke was called due to right-sided weakness and left gaze deviation.  She was found to have left MCA territory stroke with LVO. NIH 22 on admission.  She was intubated and went for emergent thrombectomy.  Postprocedural CT showed small SAH. She was extubated 3/20.    Palliative Medicine was consulted for goals of care. After speaking with daughter/Stephanie Pace on 3/21, patient was transitioned to comfort care.  Subjective: Chart reviewed. Update received from RN. Patient assessed at bedside.   Patient appears comfortable. She is awake and attempting to interact with family at the bedside. Respirations are even and unlabored. No excessive respiratory secretions noted. Family (nephew and his wife) present at bedside. They many questions/concerns, which I answered to the best of my ability.   I spoke with daughter/Stephanie Pace by phone. I updated her that patient seems comfortable. Marcelino Duster is questioning if she is "doing the right thing" - I provided reassurance that comfort care is medically appropriate and in line with her mother's previously stated wishes. Discussed plan to transfer to hospice facility in [facility] today. Education and counseling provided on natural trajectory at EOL. Emotional  support provided.      Objective:  Physical Exam Constitutional:      General: She is awake. She is not in acute distress.    Appearance: She is ill-appearing.  Pulmonary:     Effort: No respiratory distress.  Neurological:     Motor: Weakness present.     Comments: aphasia             Palliative Medicine Assessment & Plan   Assessment: Principal Problem:   Middle cerebral artery embolism, left Active Problems:   COPD (chronic obstructive pulmonary disease) (HCC)   Essential hypertension   Generalized anxiety disorder   Hypokalemia   Type 2 diabetes mellitus  without complication (HCC)   A-fib (HCC)   Status post stroke   Stroke (cerebrum) (HCC)   Hypertensive emergency   Hyperlipidemia   Tobacco use disorder   Dysphagia   Obesity   Anemia    Recommendations/Plan: Continue comfort measures Continue scheduled morphine IV every 4 hours Transfer to hospice facility in [facility] - bed is available today    Symptom Management:  Morphine prn for pain or dyspnea Lorazepam (ATIVAN) prn for anxiety Haloperidol (HALDOL) prn for agitation  Glycopyrrolate (ROBINUL) for excessive secretions Ondansetron (ZOFRAN) prn for nausea Polyvinyl alcohol (LIQUIFILM TEARS) prn for dry eyes Antiseptic oral rinse (BIOTENE) prn for dry mouth     Code Status: DNR - comfort     Prognosis:  < 2 weeks  Discharge Planning: Hospice facility    Thank you for allowing the Palliative Medicine Team to assist in the care of this patient.   Time: 50 minutes  Detailed review of medical records (labs, imaging, vital signs), medically appropriate exam, discussed with treatment team, counseling and education to patient, family, & staff, documenting clinical information, medication management, coordination of care.    Merry Proud, NP   Please contact Palliative Medicine Team phone at [phone] for questions and concerns.  For individual providers, please see  [facility].

## 2024-02-29 ENCOUNTER — Ambulatory Visit: Admitting: Orthopedic Surgery

## 2024-03-08 DEATH — deceased
